# Patient Record
Sex: Male | Born: 1975 | Race: Black or African American | Hispanic: No | Marital: Married | Smoking: Former smoker
Health system: Southern US, Community
[De-identification: ages and names within clinical notes are randomized; demographics above are authoritative.]

## PROBLEM LIST (undated history)

## (undated) DIAGNOSIS — I10 Essential (primary) hypertension: Secondary | ICD-10-CM

## (undated) DIAGNOSIS — E785 Hyperlipidemia, unspecified: Secondary | ICD-10-CM

## (undated) DIAGNOSIS — G35 Multiple sclerosis: Secondary | ICD-10-CM

## (undated) DIAGNOSIS — G35D Multiple sclerosis, unspecified: Secondary | ICD-10-CM

## (undated) HISTORY — DX: Essential (primary) hypertension: I10

## (undated) HISTORY — PX: KNEE SURGERY: SHX244

## (undated) HISTORY — DX: Hyperlipidemia, unspecified: E78.5

---

## 2010-12-25 DIAGNOSIS — F909 Attention-deficit hyperactivity disorder, unspecified type: Secondary | ICD-10-CM | POA: Insufficient documentation

## 2011-02-07 DIAGNOSIS — F458 Other somatoform disorders: Secondary | ICD-10-CM | POA: Insufficient documentation

## 2011-11-30 DIAGNOSIS — M5412 Radiculopathy, cervical region: Secondary | ICD-10-CM | POA: Insufficient documentation

## 2012-01-10 DIAGNOSIS — Z9889 Other specified postprocedural states: Secondary | ICD-10-CM | POA: Insufficient documentation

## 2012-10-07 DIAGNOSIS — J45909 Unspecified asthma, uncomplicated: Secondary | ICD-10-CM | POA: Insufficient documentation

## 2012-10-07 DIAGNOSIS — E669 Obesity, unspecified: Secondary | ICD-10-CM | POA: Insufficient documentation

## 2014-10-22 DIAGNOSIS — R519 Headache, unspecified: Secondary | ICD-10-CM | POA: Insufficient documentation

## 2019-08-10 DIAGNOSIS — G35 Multiple sclerosis: Secondary | ICD-10-CM | POA: Insufficient documentation

## 2019-08-10 DIAGNOSIS — G35A Relapsing-remitting multiple sclerosis: Secondary | ICD-10-CM | POA: Insufficient documentation

## 2019-08-10 DIAGNOSIS — N400 Enlarged prostate without lower urinary tract symptoms: Secondary | ICD-10-CM | POA: Insufficient documentation

## 2019-08-10 DIAGNOSIS — G629 Polyneuropathy, unspecified: Secondary | ICD-10-CM | POA: Insufficient documentation

## 2019-11-12 DIAGNOSIS — I10 Essential (primary) hypertension: Secondary | ICD-10-CM | POA: Insufficient documentation

## 2019-12-04 ENCOUNTER — Ambulatory Visit: Payer: Self-pay | Admitting: Cardiology

## 2019-12-25 ENCOUNTER — Ambulatory Visit: Payer: Self-pay | Admitting: Cardiology

## 2020-03-26 ENCOUNTER — Other Ambulatory Visit: Payer: Self-pay

## 2020-03-26 ENCOUNTER — Encounter (HOSPITAL_COMMUNITY): Payer: Self-pay | Admitting: Emergency Medicine

## 2020-03-26 ENCOUNTER — Emergency Department (HOSPITAL_COMMUNITY)
Admission: EM | Admit: 2020-03-26 | Discharge: 2020-03-26 | Disposition: A | Discharge status: Home / Self Care | Payer: 59 | Attending: Emergency Medicine | Admitting: Emergency Medicine

## 2020-03-26 ENCOUNTER — Emergency Department (HOSPITAL_COMMUNITY): Payer: 59

## 2020-03-26 DIAGNOSIS — G35 Multiple sclerosis: Secondary | ICD-10-CM | POA: Insufficient documentation

## 2020-03-26 DIAGNOSIS — M795 Residual foreign body in soft tissue: Secondary | ICD-10-CM | POA: Insufficient documentation

## 2020-03-26 HISTORY — DX: Multiple sclerosis, unspecified: G35.D

## 2020-03-26 HISTORY — DX: Multiple sclerosis: G35

## 2020-03-26 MED ORDER — BACITRACIN ZINC 500 UNIT/GM EX OINT: 1.0000 "application " | TOPICAL_OINTMENT | Freq: Two times a day (BID) | CUTANEOUS | 0 refills | Status: DC

## 2020-03-26 MED ORDER — LIDOCAINE HCL (PF) 1 % IJ SOLN
5.0000 mL | Freq: Once | INTRAMUSCULAR | Status: DC
  Filled 2020-03-26: qty 30

## 2020-03-26 MED ORDER — CEPHALEXIN 500 MG PO CAPS: 500.0000 mg | ORAL_CAPSULE | Freq: Two times a day (BID) | ORAL | 0 refills | Status: AC

## 2020-03-26 NOTE — ED Triage Notes (Signed)
Pt states that several days ago he slid his  R foot under the bed and got a splinter. Tried to get it out but it broke off. States it is now starting to hurt more. Alert and oriented.

## 2020-03-26 NOTE — Discharge Instructions (Signed)
Let warm soapy water run over your foot Apply the bacitracin ointment Take the antibiotics as prescribed  Follow-up with PCP in 3 to 4 days for wound recheck.  Return for new or worsening symptoms such as yellow discharge, redness or swelling going up your leg.

## 2020-03-26 NOTE — ED Provider Notes (Signed)
[facility]-EMERGENCY DEPT Provider Note   CSN: [id number] Arrival date & time: 03/26/20  4098    History Chief Complaint  Patient presents with  . Foreign Body in Skin    Alex Ramirez is a 44 y.o. male with past medical history significant for MS who presents for evaluation of retained foreign object in foot. States 2 days ago slid his right foot under the bed. Hit is wooden foot board. Noticed a splinter to the dorsal aspect right foot. Was able to remove part of this however states he thought he saw a remnant of splinter in his foot. States has had increased pain and possible swelling to dorsal right foot. Last tetanus 2 days ago. No fever, chills, nausea, vomiting,  warmth, paresthesias, weakness, Ambulatory without difficulty PTA. Denies additional aggravating or alleviating factors.   History obtained from patient and past medical records. No interpretor was used.      HPI     Past Medical History:  Diagnosis Date  . MS (multiple sclerosis) (HCC)     There are no problems to display for this patient.   History reviewed     No family history on file.  Social History   Tobacco Use  . Smoking status: Never Smoker  Substance Use Topics  . Alcohol use: Not Currently  . Drug use: Never    Home Medications Prior to Admission medications   Medication Sig Start Date End Date Taking? Authorizing Provider  bacitracin ointment Apply 1 application topically 2 (two) times daily. 03/26/20   Vern Prestia A, PA-C  cephALEXin (KEFLEX) 500 MG capsule Take 1 capsule (500 mg total) by mouth 2 (two) times daily for 5 days. 03/26/20 03/31/20  Vashti Bolanos A, PA-C    Allergies    Patient has no known allergies.  Review of Systems   Review of Systems  Constitutional: Negative.   HENT: Negative.   Respiratory: Negative.   Cardiovascular: Negative.   Gastrointestinal: Negative.   Genitourinary: Negative.   Musculoskeletal: Negative.   Skin:  Positive for wound.  Neurological: Negative.   All other systems reviewed and are negative.   Physical Exam Updated Vital Signs BP 131/75 (BP Location: Left Arm)   Pulse 73   Temp 97.7 F (36.5 C) (Oral)   Resp 18   Ht 6\' 2"  (1.88 m)   Wt 117.9 kg   SpO2 100%   BMI 33.38 kg/m   Physical Exam Vitals and nursing note reviewed.  Constitutional:      General: He is not in acute distress.    Appearance: He is well-developed. He is not ill-appearing, toxic-appearing or diaphoretic.  HENT:     Head: Normocephalic and atraumatic.     Nose: Nose normal.     Mouth/Throat:     Mouth: Mucous membranes are moist.  Eyes:     Pupils: Pupils are equal, round, and reactive to light.  Cardiovascular:     Rate and Rhythm: Normal rate and regular rhythm.     Pulses: Normal pulses.     Heart sounds: Normal heart sounds.  Pulmonary:     Effort: Pulmonary effort is normal. No respiratory distress.     Breath sounds: Normal breath sounds.  Abdominal:     General: Bowel sounds are normal. There is no distension.     Palpations: Abdomen is soft. There is no mass.  Musculoskeletal:        General: Normal range of motion.     Cervical back: Normal range  of motion and neck supple.     Comments: Moves all 4 extremities without difficulty. No bony tenderness. Compartments soft.  Skin:    General: Skin is warm and dry.     Capillary Refill: Capillary refill takes less than 2 seconds.     Comments: Punctate area of scab over dorsal right foot. Mild 10mm area of erythema, mildly tender. No fluctuance or induration.  Neurological:     General: No focal deficit present.     Mental Status: He is alert and oriented to person, place, and time.       ED Results / Procedures / Treatments   Labs (all labs ordered are listed, but only abnormal results are displayed) Labs Reviewed - No data to display  EKG None  Radiology DG Foot Complete Right  Result Date: 03/26/2020 CLINICAL DATA:  Possible  retained foreign body EXAM: RIGHT FOOT COMPLETE - 3+ VIEW COMPARISON:  None. FINDINGS: No radiopaque foreign body identified.  No fractures. IMPRESSION: No radiopaque foreign body identified. Lucretia Roers is often not radiopaque however. No fractures. Electronically Signed   By: Gerome Sam III M.D   On: 03/26/2020 09:33    Procedures .Foreign Body Removal  Date/Time: 03/26/2020 10:02 AM Performed by: Ralph Leyden A, PA-C Authorized by: Ralph Leyden A, PA-C  Body area: skin General location: lower extremity Location details: right foot Anesthesia: local infiltration  Anesthesia: Local Anesthetic: lidocaine 1% without epinephrine Anesthetic total: 2 mL  Sedation: Patient sedated: no  Patient restrained: no Patient cooperative: yes Removal mechanism: forceps and scalpel Dressing: antibiotic ointment Tendon involvement: none Depth: subcutaneous Complexity: simple 1 objects recovered. Objects recovered: wood splinter Post-procedure assessment: foreign body removed Patient tolerance: patient tolerated the procedure well with no immediate complications   (including critical care time)  Medications Ordered in ED Medications  lidocaine (PF) (XYLOCAINE) 1 % injection 5 mL (5 mLs Infiltration Handoff 03/26/20 0925)   ED Course  I have reviewed the triage vital signs and the nursing notes.  Pertinent labs & imaging results that were available during my care of the patient were reviewed by me and considered in my medical decision making (see chart for details).  44 year old presents for evaluation of splinter to dorsal right foot. Afebrile, non septic, non ill appearing. Possible retained foreign body to foot. See puncture wound with mild surrounding erythema, No fluctuance or induration. NV intact. Tetanus up to date. Will plan on imaging as no visible foreign body.   X-ray does not have any foreign object.  Was able to numb the area.  Wound thoroughly cleaned.  Was able to  unroof patient scab.  Small 3 mm incision made.  Was able to remove the 4 mm wooden splinter.  Wound irrigated, bacitracin ointment placed, wound covered.  Start on antibiotics, have him follow-up outpatient for wound recheck.  The patient has been appropriately medically screened and/or stabilized in the ED. I have low suspicion for any other emergent medical condition which would require further screening, evaluation or treatment in the ED or require inpatient management.  Patient is hemodynamically stable and in no acute distress.  Patient able to ambulate in department prior to ED.  Evaluation does not show acute pathology that would require ongoing or additional emergent interventions while in the emergency department or further inpatient treatment.  I have discussed the diagnosis with the patient and answered all questions.  Pain is been managed while in the emergency department and patient has no further complaints prior to discharge.  Patient is comfortable with plan discussed in room and is stable for discharge at this time.  I have discussed strict return precautions for returning to the emergency department.  Patient was encouraged to follow-up with PCP/specialist refer to at discharge.    MDM Rules/Calculators/A&P                           Final Clinical Impression(s) / ED Diagnoses Final diagnoses:  Foreign body (FB) in soft tissue    Rx / DC Orders ED Discharge Orders         Ordered    cephALEXin (KEFLEX) 500 MG capsule  2 times daily        03/26/20 0959    bacitracin ointment  2 times daily        03/26/20 0959           Mechell Girgis A, PA-C 03/26/20 1006    Melene Plan, DO 03/26/20 1048

## 2020-04-08 ENCOUNTER — Ambulatory Visit
Admission: RE | Admit: 2020-04-08 | Discharge: 2020-04-08 | Disposition: A | Discharge status: Home / Self Care | Payer: 59 | Source: Ambulatory Visit | Attending: Internal Medicine | Admitting: Internal Medicine

## 2020-04-08 ENCOUNTER — Other Ambulatory Visit: Payer: Self-pay | Admitting: Internal Medicine

## 2020-04-08 ENCOUNTER — Other Ambulatory Visit: Payer: Self-pay

## 2020-04-08 DIAGNOSIS — R0609 Other forms of dyspnea: Secondary | ICD-10-CM

## 2020-04-16 ENCOUNTER — Emergency Department (HOSPITAL_COMMUNITY)
Admission: EM | Admit: 2020-04-16 | Discharge: 2020-04-16 | Disposition: A | Discharge status: Home / Self Care | Payer: 59 | Attending: Emergency Medicine | Admitting: Emergency Medicine

## 2020-04-16 ENCOUNTER — Emergency Department (HOSPITAL_COMMUNITY): Payer: 59

## 2020-04-16 ENCOUNTER — Other Ambulatory Visit: Payer: Self-pay

## 2020-04-16 ENCOUNTER — Encounter (HOSPITAL_COMMUNITY): Payer: Self-pay

## 2020-04-16 DIAGNOSIS — X500XXA Overexertion from strenuous movement or load, initial encounter: Secondary | ICD-10-CM | POA: Diagnosis not present

## 2020-04-16 DIAGNOSIS — M25521 Pain in right elbow: Secondary | ICD-10-CM | POA: Insufficient documentation

## 2020-04-16 NOTE — Discharge Instructions (Signed)
Suspect you likely have a strain of your elbow, potentially over the triceps tendon, please use ibuprofen, Tylenol, ice and Ace wrap if it helps provide more comfort.  If not improving please follow-up with orthopedics for further evaluation.

## 2020-04-16 NOTE — ED Provider Notes (Signed)
[facility] EMERGENCY DEPARTMENT Provider Note   CSN: [id number] Arrival date & time: 04/16/20  0730     History Chief Complaint  Patient presents with  . Arm Pain    right    Alex Ramirez is a 44 y.o. male.  Alex Ramirez is a 44 y.o. male with a history of MS, who presents to the ED for evaluation of injury to his right arm.  He reports that last night he was lifting weights and doing over the head tricep curls when his right arm and elbow seem to give out and his arm swung sideways.  He reports since then he has been having pain primarily over the elbow, but has not noted significant swelling.  He is still able to flex and extend the arm.  Reports pain radiates intermittently into the upper and lower arm, primarily with movement.  Denies any numbness tingling or weakness in the arm.  No history of injury or surgery.  He reports that he took some ibuprofen and applied ice with minimal relief.  No other aggravating or alleviating factors.        Past Medical History:  Diagnosis Date  . MS (multiple sclerosis) (HCC)     There are no problems to display for this patient.   Past Surgical History:  Procedure Laterality Date  . KNEE SURGERY Right    x 4, ACL       No family history on file.  Social History   Tobacco Use  . Smoking status: Never Smoker  Substance Use Topics  . Alcohol use: Not Currently  . Drug use: Never    Home Medications Prior to Admission medications   Medication Sig Start Date End Date Taking? Authorizing Provider  bacitracin ointment Apply 1 application topically 2 (two) times daily. 03/26/20   Henderly, Britni A, PA-C    Allergies    Patient has no known allergies.  Review of Systems   Review of Systems  Constitutional: Negative for chills and fever.  Musculoskeletal: Positive for arthralgias. Negative for joint swelling.  Skin: Negative for color change and rash.  Neurological: Negative for weakness and numbness.     Physical Exam Updated Vital Signs BP 110/69 (BP Location: Right Arm)   Pulse 82   Temp 98.1 F (36.7 C) (Oral)   Resp 18   SpO2 98%   Physical Exam Vitals and nursing note reviewed.  Constitutional:      General: He is not in acute distress.    Appearance: Normal appearance. He is well-developed, normal weight and well-nourished. He is not ill-appearing or diaphoretic.  HENT:     Head: Normocephalic and atraumatic.  Eyes:     General:        Right eye: No discharge.        Left eye: No discharge.  Pulmonary:     Effort: Pulmonary effort is normal. No respiratory distress.  Musculoskeletal:        General: Tenderness present.     Cervical back: Neck supple.     Comments: Tenderness over the right elbow around the olecranon process without any swelling or effusion, no palpable deformity, no significant swelling of the olecranon bursa, patient has full flexion and extension of the elbow with 5/5 strength against resistance, no bony or soft tissue tenderness of the forearm or upper arm.  Distal pulses 2+, normal sensation and strength.  Skin:    General: Skin is warm and dry.  Neurological:  Mental Status: He is alert and oriented to person, place, and time.     Coordination: Coordination normal.  Psychiatric:        Mood and Affect: Mood and affect and mood normal.        Behavior: Behavior normal.     ED Results / Procedures / Treatments   Labs (all labs ordered are listed, but only abnormal results are displayed) Labs Reviewed - No data to display  EKG None  Radiology DG Forearm Right  Result Date: 04/16/2020 CLINICAL DATA:  injury EXAM: RIGHT FOREARM - 2 VIEW COMPARISON:  None. FINDINGS: There is no evidence of fracture or other focal bone lesions. Soft tissues are unremarkable. IMPRESSION: No acute osseous abnormality. Electronically Signed   By: Stana Bunting M.D.   On: 04/16/2020 08:17   DG Humerus Right  Result Date: 04/16/2020 CLINICAL DATA:   Right arm pain after injury. EXAM: RIGHT HUMERUS - 2+ VIEW COMPARISON:  None. FINDINGS: There is no evidence of fracture or other focal bone lesions. Soft tissues are unremarkable. IMPRESSION: Negative. Electronically Signed   By: Lupita Raider M.D.   On: 04/16/2020 08:16    Procedures Procedures (including critical care time)  Medications Ordered in ED Medications - No data to display  ED Course  I have reviewed the triage vital signs and the nursing notes.  Pertinent labs & imaging results that were available during my care of the patient were reviewed by me and considered in my medical decision making (see chart for details).    MDM Rules/Calculators/A&P                          44 year old male presents with elbow injury while lifting weights.  He has no significant effusion on exam and the right upper extremity is neurovascularly intact.  Tenderness primarily present over the olecranon without significant swelling of the olecranon bursa.  X-rays of the forearm and humerus with no acute osseous abnormality.  Suspect he may have strained the elbow, in particular over the triceps tendon but he has intact strength of the triceps and biceps.  Encouraged RICE therapy and orthopedic follow-up if not improving.  Return precautions discussed.  Discharged home in good condition.  Final Clinical Impression(s) / ED Diagnoses Final diagnoses:  Right elbow pain    Rx / DC Orders ED Discharge Orders    None       Dartha Lodge, [place] 04/16/20 1157    LongArlyss Repress, MD 04/17/20 [phone]

## 2020-04-16 NOTE — ED Triage Notes (Signed)
Patient complains of right forearm pain after bending backwards while lifting weights last night. Took ibuprofen and iced with minimal relief

## 2020-04-22 ENCOUNTER — Other Ambulatory Visit: Payer: Self-pay | Admitting: Internal Medicine

## 2020-04-22 DIAGNOSIS — G35 Multiple sclerosis: Secondary | ICD-10-CM

## 2020-05-16 ENCOUNTER — Ambulatory Visit: Payer: Self-pay

## 2020-05-16 ENCOUNTER — Ambulatory Visit: Payer: 59 | Admitting: Physician Assistant

## 2020-05-16 ENCOUNTER — Encounter: Payer: Self-pay | Admitting: Physician Assistant

## 2020-05-16 ENCOUNTER — Ambulatory Visit (INDEPENDENT_AMBULATORY_CARE_PROVIDER_SITE_OTHER): Payer: 59 | Admitting: Physician Assistant

## 2020-05-16 DIAGNOSIS — M25572 Pain in left ankle and joints of left foot: Secondary | ICD-10-CM | POA: Diagnosis not present

## 2020-05-16 DIAGNOSIS — M25521 Pain in right elbow: Secondary | ICD-10-CM | POA: Diagnosis not present

## 2020-05-16 DIAGNOSIS — F3289 Other specified depressive episodes: Secondary | ICD-10-CM | POA: Insufficient documentation

## 2020-05-16 DIAGNOSIS — E782 Mixed hyperlipidemia: Secondary | ICD-10-CM | POA: Insufficient documentation

## 2020-05-16 DIAGNOSIS — F909 Attention-deficit hyperactivity disorder, unspecified type: Secondary | ICD-10-CM | POA: Insufficient documentation

## 2020-05-16 NOTE — Progress Notes (Signed)
Office Visit Note   Patient: Alex Ramirez           Date of Birth: 1976/04/04           MRN: [id number] Visit Date: 05/16/2020              Requested by: Andi Devon, MD [address] [place],  [place] [address] PCP: Andi Devon, MD   Assessment & Plan: Visit Diagnoses:  1. Pain in left ankle and joints of left foot   2. Pain in right elbow     Plan: Reassurance was given that the elbow appears stable and there is no evidence of fracture.  He is activities as tolerated with the right arm.  In regards to his left ankle we will place him in an ASO brace.  We will send him to formal physical therapy to work on range of motion, strengthening gait balance proprioception.  Wean out of the ASO as proprioception and strength improved.  See him back in 1 month to see what type of response he has had with physical therapy in regards to the left ankle.  Follow-Up Instructions: Return in about 4 weeks (around 06/13/2020).   Orders:  Orders Placed This Encounter  Procedures  . XR Ankle Complete Left   No orders of the defined types were placed in this encounter.     Procedures: No procedures performed   Clinical Data: No additional findings.   Subjective: Chief Complaint  Patient presents with  . Left Ankle - Pain  . Right Elbow - Pain    HPI Alex Ramirez is a 45 year old male comes in today with 2 complaints right elbow pain and left ankle pain.  He was seen in the ER on 04/16/2020 for right elbow pain.  He reports that he was lifting weights with a 100 pound dumbbell doing over tricep extensions when his right arm seem to give way on him.  Radiographs were obtained and I personally reviewed these he showed no acute fractures no bony abnormalities elbow to be well located.  Does note that the elbow pain is better but he still has some pain if he bumps the elbow. Left ankle he reports injuring 10 weeks ago on a trail he rolled the ankle and is still feels ankle is  weak and is painful lateral aspect ankle.  7 no mechanical symptoms.  States pain is worse when getting out of bed in the morning.  Had no real treatment for that the ankle pain other than ibuprofen.  Does have a medical history pertinent for MS.   Review of Systems See HPI otherwise negative  Objective: Vital Signs: There were no vitals taken for this visit.  Physical Exam Constitutional:      Appearance: He is not ill-appearing or diaphoretic.  Pulmonary:     Effort: Pulmonary effort is normal.  Neurological:     Mental Status: He is alert and oriented to person, place, and time.  Psychiatric:        Mood and Affect: Mood normal.     Ortho Exam Right elbow full range of motion.  No instability valgus varus stressing.  He is tender over the olecranon near the insertion of the triceps region.  Nontender over the medial lateral epicondyles.  Has a positive Tinel's over the right elbow ulnar nerve region.  Full supination pronation of the forearms.  Distal biceps is intact.  5 out of 5 strength with extension of both elbows against resistance and no  pain at the triceps for these.  Left ankle full dorsiflexion plantarflexion.  Inversion eversion of the foot against resistance reveals 5 5 strength.  Maximal tenderness over the distal one fourth the lateral malleolus.  No tenderness over the medial malleolus.  Achilles is nontender.  Pes planus deformity left foot.  Dorsal pedal pulses 2+. Specialty Comments:  No specialty comments available.  Imaging: XR Ankle Complete Left  Result Date: 05/16/2020 Left ankle 3 views: No acute fracture.  Talus well located within the ankle mortise.  No diastases.  No evidence of prior fracture lateral malleolus.    PMFS History: Patient Active Problem List   Diagnosis Date Noted  . 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 Adult attention deficit hyperactivity disorder 05/16/2020  . Mixed hyperlipidemia 05/16/2020  . Benign essential  hypertension 11/12/2019  . BPH (benign prostatic hyperplasia) 08/10/2019  . Neuropathy 08/10/2019  . Multiple sclerosis (HCC) 08/10/2019  . Pressure in head 10/22/2014  . Obesity 10/07/2012  . Mild asthma 10/07/2012  . S/P ACL reconstruction 01/10/2012  . Brachial neuritis or radiculitis 11/30/2011  . 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 ADHD (attention deficit hyperactivity disorder) 12/25/2010   Past Medical History:  Diagnosis Date  . MS (multiple sclerosis) (HCC)     History reviewed. No pertinent family history.  Past Surgical History:  Procedure Laterality Date  . KNEE SURGERY Right    x 4, ACL   Social History   Occupational History  . Not on file  Tobacco Use  . Smoking status: Never Smoker  . Smokeless tobacco: Not on file  Substance and Sexual Activity  . Alcohol use: Not Currently  . Drug use: Never  . Sexual activity: Not on file

## 2020-05-20 ENCOUNTER — Other Ambulatory Visit: Payer: Self-pay

## 2020-05-20 ENCOUNTER — Ambulatory Visit
Admission: RE | Admit: 2020-05-20 | Discharge: 2020-05-20 | Disposition: A | Discharge status: Home / Self Care | Payer: 59 | Source: Ambulatory Visit | Attending: Internal Medicine | Admitting: Internal Medicine

## 2020-05-20 DIAGNOSIS — G35 Multiple sclerosis: Secondary | ICD-10-CM

## 2020-05-20 MED ORDER — GADOBENATE DIMEGLUMINE 529 MG/ML IV SOLN
20.0000 mL | Freq: Once | INTRAVENOUS | Status: AC | PRN
  Administered 2020-05-20: 20 mL via INTRAVENOUS

## 2020-05-24 ENCOUNTER — Other Ambulatory Visit: Payer: 59

## 2020-05-29 ENCOUNTER — Ambulatory Visit
Admission: RE | Admit: 2020-05-29 | Discharge: 2020-05-29 | Disposition: A | Discharge status: Home / Self Care | Payer: 59 | Source: Ambulatory Visit | Attending: Internal Medicine | Admitting: Internal Medicine

## 2020-05-29 ENCOUNTER — Other Ambulatory Visit: Payer: Self-pay

## 2020-05-29 DIAGNOSIS — G35 Multiple sclerosis: Secondary | ICD-10-CM

## 2020-05-29 MED ORDER — GADOBENATE DIMEGLUMINE 529 MG/ML IV SOLN
20.0000 mL | Freq: Once | INTRAVENOUS | Status: AC | PRN
  Administered 2020-05-29: 20 mL via INTRAVENOUS

## 2020-06-13 ENCOUNTER — Ambulatory Visit: Payer: 59 | Admitting: Physician Assistant

## 2020-11-29 ENCOUNTER — Telehealth: Payer: Self-pay

## 2020-11-29 NOTE — Telephone Encounter (Signed)
Referral notes sent from [facility], Phone #: [phone], Fax #: [phone]   Notes sent to scheduling

## 2020-12-26 ENCOUNTER — Other Ambulatory Visit (HOSPITAL_COMMUNITY): Payer: Self-pay | Admitting: Internal Medicine

## 2020-12-27 ENCOUNTER — Ambulatory Visit (HOSPITAL_COMMUNITY)
Admission: RE | Admit: 2020-12-27 | Discharge: 2020-12-27 | Disposition: A | Discharge status: Home / Self Care | Payer: Self-pay | Source: Ambulatory Visit | Attending: Internal Medicine | Admitting: Internal Medicine

## 2020-12-27 ENCOUNTER — Other Ambulatory Visit: Payer: Self-pay

## 2020-12-27 DIAGNOSIS — E785 Hyperlipidemia, unspecified: Secondary | ICD-10-CM | POA: Insufficient documentation

## 2020-12-27 DIAGNOSIS — Z136 Encounter for screening for cardiovascular disorders: Secondary | ICD-10-CM | POA: Insufficient documentation

## 2021-01-10 ENCOUNTER — Other Ambulatory Visit: Payer: Self-pay

## 2021-01-10 ENCOUNTER — Ambulatory Visit (INDEPENDENT_AMBULATORY_CARE_PROVIDER_SITE_OTHER): Payer: 59

## 2021-01-10 ENCOUNTER — Encounter: Payer: Self-pay | Admitting: Cardiology

## 2021-01-10 ENCOUNTER — Ambulatory Visit (INDEPENDENT_AMBULATORY_CARE_PROVIDER_SITE_OTHER): Payer: 59 | Admitting: Cardiology

## 2021-01-10 VITALS — BP 122/76 | HR 83 | Ht 74.0 in | Wt 287.0 lb

## 2021-01-10 DIAGNOSIS — R0602 Shortness of breath: Secondary | ICD-10-CM | POA: Insufficient documentation

## 2021-01-10 DIAGNOSIS — E782 Mixed hyperlipidemia: Secondary | ICD-10-CM

## 2021-01-10 DIAGNOSIS — G35 Multiple sclerosis: Secondary | ICD-10-CM | POA: Diagnosis not present

## 2021-01-10 DIAGNOSIS — E669 Obesity, unspecified: Secondary | ICD-10-CM | POA: Diagnosis not present

## 2021-01-10 DIAGNOSIS — I10 Essential (primary) hypertension: Secondary | ICD-10-CM

## 2021-01-10 DIAGNOSIS — R002 Palpitations: Secondary | ICD-10-CM

## 2021-01-10 NOTE — Patient Instructions (Signed)
Medication Instructions:  Your physician recommends that you continue on your current medications as directed. Please refer to the Current Medication list given to you today.  *If you need a refill on your cardiac medications before your next appointment, please call your pharmacy*   Lab Work: None If you have labs (blood work) drawn today and your tests are completely normal, you will receive your results only by: MyChart Message (if you have MyChart) OR A paper copy in the mail If you have any lab test that is abnormal or we need to change your treatment, we will call you to review the results.   Testing/Procedures: Your physician has requested that you have an echocardiogram. Echocardiography is a painless test that uses sound waves to create images of your heart. It provides your doctor with information about the size and shape of your heart and how well your heart's chambers and valves are working. This procedure takes approximately one hour. There are no restrictions for this procedure.  A zio monitor was ordered today. It will remain on for 14 days. You will then return monitor and event diary in provided box. It takes 1-2 weeks for report to be downloaded and returned to [place]. We will call you with the results. If monitor falls off or has orange flashing light, please call Zio for further instructions.   ZIO  WHY IS MY DOCTOR PRESCRIBING ZIO? The Zio system is proven and trusted by physicians to detect and diagnose irregular heart rhythms -- and has been prescribed to hundreds of thousands of patients.  The FDA has cleared the Zio system to monitor for many different kinds of irregular heart rhythms. In a study, physicians were able to reach a diagnosis 90% of the time with the Zio system1.  You can wear the Zio monitor -- a small, discreet, comfortable patch -- during your normal day-to-day activity, including while you sleep, shower, and exercise, while it records every single  heartbeat for analysis.  1Barrett, P., et al. Comparison of 24 Hour Holter Monitoring Versus 14 Day Novel Adhesive Patch Electrocardiographic Monitoring. American Journal of Medicine, 2014.  ZIO VS. HOLTER MONITORING The Zio monitor can be comfortably worn for up to 14 days. Holter monitors can be worn for 24 to 48 hours, limiting the time to record any irregular heart rhythms you may have. Zio is able to capture data for the 51% of patients who have their first symptom-triggered arrhythmia after 48 hours.1  LIVE WITHOUT RESTRICTIONS The Zio ambulatory cardiac monitor is a small, unobtrusive, and water-resistant patch--you might even forget you're wearing it. The Zio monitor records and stores every beat of your heart, whether you're sleeping, working out, or showering. Remove 14 days after application.    Follow-Up: At [facility], you and your health needs are our priority.  As part of our continuing mission to provide you with exceptional heart care, we have created designated Provider Care Teams.  These Care Teams include your primary Cardiologist (physician) and Advanced Practice Providers (APPs -  Physician Assistants and Nurse Practitioners) who all work together to provide you with the care you need, when you need it.  We recommend signing up for the patient portal called "MyChart".  Sign up information is provided on this After Visit Summary.  MyChart is used to connect with patients for Virtual Visits (Telemedicine).  Patients are able to view lab/test results, encounter notes, upcoming appointments, etc.  Non-urgent messages can be sent to your provider as well.  To learn more about what you can do with MyChart, go to [email or web address].    Your next appointment:   6 month(s)  The format for your next appointment:   In Person  Provider:   Thomasene Ripple, DO [address], [place], [place] [address]    Other Instructions Echocardiogram An echocardiogram is a test  that uses sound waves (ultrasound) to produce images of the heart. Images from an echocardiogram can provide important information about: Heart size and shape. The size and thickness and movement of your heart's walls. Heart muscle function and strength. Heart valve function or if you have stenosis. Stenosis is when the heart valves are too narrow. If blood is flowing backward through the heart valves (regurgitation). A tumor or infectious growth around the heart valves. Areas of heart muscle that are not working well because of poor blood flow or injury from a heart attack. Aneurysm detection. An aneurysm is a weak or damaged part of an artery wall. The wall bulges out from the normal force of blood pumping through the body. Tell a health care provider about: Any allergies you have. All medicines you are taking, including vitamins, herbs, eye drops, creams, and over-the-counter medicines. Any blood disorders you have. Any surgeries you have had. Any medical conditions you have. Whether you are pregnant or may be pregnant. What are the risks? Generally, this is a safe test. However, problems may occur, including an allergic reaction to dye (contrast) that may be used during the test. What happens before the test? No specific preparation is needed. You may eat and drink normally. What happens during the test?  You will take off your clothes from the waist up and put on a hospital gown. Electrodes or electrocardiogram (ECG)patches may be placed on your chest. The electrodes or patches are then connected to a device that monitors your heart rate and rhythm. You will lie down on a table for an ultrasound exam. A gel will be applied to your chest to help sound waves pass through your skin. A handheld device, called a transducer, will be pressed against your chest and moved over your heart. The transducer produces sound waves that travel to your heart and bounce back (or "echo" back) to the  transducer. These sound waves will be captured in real-time and changed into images of your heart that can be viewed on a video monitor. The images will be recorded on a computer and reviewed by your health care provider. You may be asked to change positions or hold your breath for a short time. This makes it easier to get different views or better views of your heart. In some cases, you may receive contrast through an IV in one of your veins. This can improve the quality of the pictures from your heart. The procedure may vary among health care providers and hospitals. What can I expect after the test? You may return to your normal, everyday life, including diet, activities, and medicines, unless your health care provider tells you not to do that. Follow these instructions at home: It is up to you to get the results of your test. Ask your health care provider, or the department that is doing the test, when your results will be ready. Keep all follow-up visits. This is important. Summary An echocardiogram is a test that uses sound waves (ultrasound) to produce images of the heart. Images from an echocardiogram can provide important information about the size and shape of your heart, heart muscle function,  heart valve function, and other possible heart problems. You do not need to do anything to prepare before this test. You may eat and drink normally. After the echocardiogram is completed, you may return to your normal, everyday life, unless your health care provider tells you not to do that. This information is not intended to replace advice given to you by your health care provider. Make sure you discuss any questions you have with your health care provider. Document Revised: 12/15/2019 Document Reviewed: 12/15/2019 Elsevier Patient Education  2022 [employer].

## 2021-01-10 NOTE — Progress Notes (Signed)
Cardiology Office Note:    Date:  01/10/2021   ID:  Alex Ramirez, DOB 1975-05-30, MRN [id number]  PCP:  Andi Devon, MD  Cardiologist:  Thomasene Ripple, DO  Electrophysiologist:  None   Referring MD: Andi Devon, MD   " I am having palpitations"  History of Present Illness:    Alex Ramirez is a 45 y.o. male with a hx of hypertension, hyperlipidemia, obesity, multiple sclerosis follows with neurology is here today to be evaluated for palpitations as well as establish cardiac care.  The patient tells me that recently he has been experiencing intermittent palpitations.  He described it as mostly an abrupt onset of fast heartbeat which last for 30 to 40 minutes most times and then resolves.  Sometimes they resolve abruptly and sometimes is a slow offset.  Admits to associated shortness of breath. He also has had some chest pain and underwent a coronary CT calcium scoring which did show 0 calcium.  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  At that time he told me that the previous cardiologist had recommended possible pacemaker giving the medications that he was going to be on.  But that was not pursued.  He has not had any syncope episode, no lightheadedness or dizziness.  He is tolerating his current medical therapy for his multiple sclerosis.  Past Medical History:  Diagnosis Date   MS (multiple sclerosis) (HCC)     Past Surgical History:  Procedure Laterality Date   KNEE SURGERY Right    x 4, ACL    Current Medications: Current Meds  Medication Sig   amphetamine-dextroamphetamine (ADDERALL) 10 MG tablet Take 10 mg by mouth daily.   bacitracin ointment Apply 1 application topically 2 (two) times daily.   baclofen (LIORESAL) 10 MG tablet Take 10 mg by mouth 3 (three) times daily.   buPROPion (WELLBUTRIN SR) 150 MG 12 hr tablet Take 150 mg by mouth 2  (two) times daily.   busPIRone (BUSPAR) 5 MG tablet Take 5 mg by mouth 2 (two) times daily.   Dimethyl Fumarate 120 MG CPDR 1 capsule in the morning and at bedtime.   ezetimibe (ZETIA) 10 MG tablet Take 10 mg by mouth daily.   gabapentin (NEURONTIN) 600 MG tablet Take 600 mg by mouth 3 (three) times daily.   hydrochlorothiazide (HYDRODIURIL) 12.5 MG tablet Take 12.5 mg by mouth daily.   lamoTRIgine (LAMICTAL) 100 MG tablet Take 100 mg by mouth 2 (two) times daily.   terazosin (HYTRIN) 5 MG capsule 5 mg 2 (two) times daily.   [DISCONTINUED] methylphenidate (RITALIN) 10 MG tablet Take 10 mg by mouth daily.     Allergies:   Other and Oxycodone   Social History   Socioeconomic History   Marital status: Married    Spouse name: Not on file   Number of children: Not on file   Years of education: Not on file   Highest education level: Not on file  Occupational History   Not on file  Tobacco Use   Smoking status: Former    Types: Cigarettes    Passive exposure: Past   Smokeless tobacco: Never  Vaping Use   Vaping Use: Never used  Substance and Sexual Activity   Alcohol use: Yes    Comment: couple times weekly   Drug use: Never   Sexual activity: Not on file  Other Topics  Concern   Not on file  Social History Narrative   Not on file   Social Determinants of Health   Financial Resource Strain: Not on file  Food Insecurity: Not on file  Transportation Needs: Not on file  Physical Activity: Not on file  Stress: Not on file  Social Connections: Not on file     Family History: The patient's family history includes Breast cancer in his maternal grandmother; Congestive Heart Failure in his father and mother; Diabetes in his brother, father, mother, paternal grandfather, paternal grandmother, sister, and sister; Healthy in his brother, brother, brother, brother, sister, sister, sister, sister, sister, and sister; Heart attack (age of onset: 24) in his father; High Cholesterol in his  brother and father; Hypertension in his mother and paternal grandfather.  ROS:   Review of Systems  Constitution: Negative for decreased appetite, fever and weight gain.  HENT: Negative for congestion, ear discharge, hoarse voice and sore throat.   Eyes: Negative for discharge, redness, vision loss in right eye and visual halos.  Cardiovascular: Reports palpitations and shortness of breath.  Gated for chest pain, leg swelling, orthopnea and palpitations.  Respiratory: Negative for cough, hemoptysis, shortness of breath and snoring.   Endocrine: Negative for heat intolerance and polyphagia.  Hematologic/Lymphatic: Negative for bleeding problem. Does not bruise/bleed easily.  Skin: Negative for flushing, nail changes, rash and suspicious lesions.  Musculoskeletal: Negative for arthritis, joint pain, muscle cramps, myalgias, neck pain and stiffness.  Gastrointestinal: Negative for abdominal pain, bowel incontinence, diarrhea and excessive appetite.  Genitourinary: Negative for decreased libido, genital sores and incomplete emptying.  Neurological: Negative for brief paralysis, focal weakness, headaches and loss of balance.  Psychiatric/Behavioral: Negative for altered mental status, depression and suicidal ideas.  Allergic/Immunologic: Negative for HIV exposure and persistent infections.    EKGs/Labs/Other Studies Reviewed:    The following studies were reviewed today:   EKG:  The ekg ordered today demonstrates sinus rhythm 83 bpm  CT cardiac scoring 12/27/2020 CLINICAL DATA:  Cardiovascular Disease Risk stratification  EXAM: Coronary Calcium Score  TECHNIQUE: A gated, non-contrast computed tomography scan of the heart was performed using 47mm slice thickness. Axial images were analyzed on a dedicated workstation. Calcium scoring of the coronary arteries was performed using the Agatston method.   FINDINGS: Coronary Calcium Score:   Left main: 0   Left anterior descending artery:  0   Left circumflex artery: 0   Right coronary artery: 0   Total: 0   Percentile: 0   Pericardium: Normal.   Ascending Aorta: Normal caliber.   Non-cardiac: See separate report from [facility].   IMPRESSION: Coronary calcium score of 0. This was 0 percentile for age-, race-, and sex-matched controls.   RECOMMENDATIONS: Coronary artery calcium (CAC) score is a strong predictor of incident coronary heart disease (CHD) and provides predictive information beyond traditional risk factors. CAC scoring is reasonable to use in the decision to withhold, postpone, or initiate statin therapy in intermediate-risk or selected borderline-risk asymptomatic adults (age 35-75 years and LDL-C >=70 to <190 mg/dL) who do not have diabetes or established atherosclerotic cardiovascular disease (ASCVD).* In intermediate-risk (10-year ASCVD risk >=7.5% to <20%) adults or selected borderline-risk (10-year ASCVD risk >=5% to <7.5%) adults in whom a CAC score is measured for the purpose of making a treatment decision the following recommendations have been made:   If CAC=0, it is reasonable to withhold statin therapy and reassess in 5 to 10 years, as long as higher risk conditions are absent (  diabetes mellitus, family history of premature CHD in first degree relatives (males <55 years; females <65 years), cigarette smoking, or LDL >=190 mg/dL).   If CAC is 1 to 99, it is reasonable to initiate statin therapy for patients >=30 years of age.   If CAC is >=100 or >=75th percentile, it is reasonable to initiate statin therapy at any age.   Cardiology referral should be considered for patients with CAC scores >=400 or >=75th percentile.   *2018 AHA/ACC/AACVPR/AAPA/ABC/ACPM/ADA/AGS/APhA/ASPC/NLA/PCNA Guideline on the Management of Blood Cholesterol: A Report of the American College of Cardiology/American Heart Association Task Force on Clinical Practice Guidelines. J Am Coll  Cardiol. 2019;73(24):3168-3209.   Olga Millers, MD Electronically Signed  Zio monitor 01/2020 Cardiology Laboratory  [facility] Name: Alex Ramirez    Age: 74 Y  MRN: [id number]   DOB: [birth year]  Beginning Study Date: 01/02/20-01/13/20                              Cardiac Monitor Report    Reason for Study:  Second degree AVB  Type of cardiac monitor:  Zio Patch  Cardiac monitor was worn for 10 days and 18 hours.     Interpretation Summary   1. Recording quality: Fair  2. Sinus Rhythm was observed.  Average HR 88, Minimum 57, Maximum  171.  3. There were no pauses.         Second degree AVB mobitz type I was present.  Wenkebach was  detected within +/- 45 seconds of symptomatic patient event.       AV Block (2nd Mobitz II, 3rd) was not present.  4. Supraventricular Ectopic (SVE) beat burden was: <1.0%      There were no episodes of SVT.  5. Atrial fibrillation and/or flutter was not present.  6. PVC burden was: <1.0%       There were no episodes of wide complex tachycardia.  7. There were 5 patient triggered events which show Sinus and  SVEs.      There were 7 patient symptom episodes which correlate with  sinus, wenckebach, SVEs and VEs.   IMPRESSION:  Results demonstrate predominantly sinus rhythm with episodes of  symptomatic second degree AVB mobitz type I (wenkebach).      I have personally reviewed the results of this test and agree  with the report above.   Jana Half, MD  Assistant Professor (Clinical), Cardiac Electrophysiology  01/26/2020 Specimen Collected: 01/26/20 14:34    Recent Labs: No results found for requested labs within last 8760 hours.  Recent Lipid Panel No results found for: CHOL, TRIG, HDL, CHOLHDL, VLDL, LDLCALC, LDLDIRECT  Physical Exam:    VS:  BP 122/76 (BP Location: Left Arm, Patient Position: Sitting, Cuff Size: Large)   Pulse 83   Ht 6\' 2"  (1.88 m)   Wt 287 lb (130.2 kg)   SpO2 97%   BMI 36.85 kg/m      Wt Readings from Last 3 Encounters:  01/10/21 287 lb (130.2 kg)  03/26/20 260 lb (117.9 kg)     GEN: Well nourished, well developed in no acute distress HEENT: Normal NECK: No JVD; No carotid bruits LYMPHATICS: No lymphadenopathy CARDIAC: S1S2 noted,RRR, no murmurs, rubs, gallops RESPIRATORY:  Clear to auscultation without rales, wheezing or rhonchi  ABDOMEN: Soft, non-tender, non-distended, +bowel sounds, no guarding. EXTREMITIES: No edema, No cyanosis, no clubbing MUSCULOSKELETAL:  No deformity  SKIN: Warm and dry NEUROLOGIC:  Alert  and oriented x 3, non-focal PSYCHIATRIC:  Normal affect, good insight  ASSESSMENT:    1. SOB (shortness of breath)   2. Palpitations   3. Obesity (BMI 30-39.9)   4. Multiple sclerosis (HCC)   5. Benign essential hypertension   6. Mixed hyperlipidemia    PLAN:     1.  I would like to rule out a cardiovascular etiology of this palpitation, therefore at this time I would like to placed a zio patch for  14  days. In additon for his shortness of breath a transthoracic echocardiogram will be ordered to assess LV/RV function and any structural abnormalities. Once these testing have been performed amd reviewed further reccomendations will be made. For now, I do reccomend that the patient goes to the nearest ED if  symptoms recur.  Blood pressure is acceptable, continue with current antihypertensive regimen..  Hyperlipidemia - continue with current statin medication.  The patient understands the need to lose weight with diet and exercise. We have discussed specific strategies for this.  The patient is in agreement with the above plan. The patient left the office in stable condition.  The patient will follow up in 6 months or sooner if needed.   Medication Adjustments/Labs and Tests Ordered: Current medicines are reviewed at length with the patient today.  Concerns regarding medicines are outlined above.  Orders Placed This Encounter  Procedures    LONG TERM MONITOR (3-14 DAYS)   EKG 12-Lead   ECHOCARDIOGRAM COMPLETE    No orders of the defined types were placed in this encounter.   Patient Instructions  Medication Instructions:  Your physician recommends that you continue on your current medications as directed. Please refer to the Current Medication list given to you today.  *If you need a refill on your cardiac medications before your next appointment, please call your pharmacy*   Lab Work: None If you have labs (blood work) drawn today and your tests are completely normal, you will receive your results only by: MyChart Message (if you have MyChart) OR A paper copy in the mail If you have any lab test that is abnormal or we need to change your treatment, we will call you to review the results.   Testing/Procedures: Your physician has requested that you have an echocardiogram. Echocardiography is a painless test that uses sound waves to create images of your heart. It provides your doctor with information about the size and shape of your heart and how well your heart's chambers and valves are working. This procedure takes approximately one hour. There are no restrictions for this procedure.  A zio monitor was ordered today. It will remain on for 14 days. You will then return monitor and event diary in provided box. It takes 1-2 weeks for report to be downloaded and returned to [place]. We will call you with the results. If monitor falls off or has orange flashing light, please call Zio for further instructions.   ZIO  WHY IS MY DOCTOR PRESCRIBING ZIO? The Zio system is proven and trusted by physicians to detect and diagnose irregular heart rhythms -- and has been prescribed to hundreds of thousands of patients.  The FDA has cleared the Zio system to monitor for many different kinds of irregular heart rhythms. In a study, physicians were able to reach a diagnosis 90% of the time with the Zio system1.  You can wear the Zio monitor  -- a small, discreet, comfortable patch -- during your normal day-to-day activity, including while you  sleep, shower, and exercise, while it records every single heartbeat for analysis.  1Barrett, P., et al. Comparison of 24 Hour Holter Monitoring Versus 14 Day Novel Adhesive Patch Electrocardiographic Monitoring. American Journal of Medicine, 2014.  ZIO VS. HOLTER MONITORING The Zio monitor can be comfortably worn for up to 14 days. Holter monitors can be worn for 24 to 48 hours, limiting the time to record any irregular heart rhythms you may have. Zio is able to capture data for the 51% of patients who have their first symptom-triggered arrhythmia after 48 hours.1  LIVE WITHOUT RESTRICTIONS The Zio ambulatory cardiac monitor is a small, unobtrusive, and water-resistant patch--you might even forget you're wearing it. The Zio monitor records and stores every beat of your heart, whether you're sleeping, working out, or showering. Remove 14 days after application.    Follow-Up: At [facility], you and your health needs are our priority.  As part of our continuing mission to provide you with exceptional heart care, we have created designated Provider Care Teams.  These Care Teams include your primary Cardiologist (physician) and Advanced Practice Providers (APPs -  Physician Assistants and Nurse Practitioners) who all work together to provide you with the care you need, when you need it.  We recommend signing up for the patient portal called "MyChart".  Sign up information is provided on this After Visit Summary.  MyChart is used to connect with patients for Virtual Visits (Telemedicine).  Patients are able to view lab/test results, encounter notes, upcoming appointments, etc.  Non-urgent messages can be sent to your provider as well.   To learn more about what you can do with MyChart, go to [email or web address].    Your next appointment:   6 month(s)  The format for your next appointment:    In Person  Provider:   Thomasene Ripple, DO [address], [place], [place] [address]    Other Instructions Echocardiogram An echocardiogram is a test that uses sound waves (ultrasound) to produce images of the heart. Images from an echocardiogram can provide important information about: Heart size and shape. The size and thickness and movement of your heart's walls. Heart muscle function and strength. Heart valve function or if you have stenosis. Stenosis is when the heart valves are too narrow. If blood is flowing backward through the heart valves (regurgitation). A tumor or infectious growth around the heart valves. Areas of heart muscle that are not working well because of poor blood flow or injury from a heart attack. Aneurysm detection. An aneurysm is a weak or damaged part of an artery wall. The wall bulges out from the normal force of blood pumping through the body. Tell a health care provider about: Any allergies you have. All medicines you are taking, including vitamins, herbs, eye drops, creams, and over-the-counter medicines. Any blood disorders you have. Any surgeries you have had. Any medical conditions you have. Whether you are pregnant or may be pregnant. What are the risks? Generally, this is a safe test. However, problems may occur, including an allergic reaction to dye (contrast) that may be used during the test. What happens before the test? No specific preparation is needed. You may eat and drink normally. What happens during the test?  You will take off your clothes from the waist up and put on a hospital gown. Electrodes or electrocardiogram (ECG)patches may be placed on your chest. The electrodes or patches are then connected to a device that monitors your heart rate and rhythm. You will lie down  on a table for an ultrasound exam. A gel will be applied to your chest to help sound waves pass through your skin. A handheld device, called a transducer, will be  pressed against your chest and moved over your heart. The transducer produces sound waves that travel to your heart and bounce back (or "echo" back) to the transducer. These sound waves will be captured in real-time and changed into images of your heart that can be viewed on a video monitor. The images will be recorded on a computer and reviewed by your health care provider. You may be asked to change positions or hold your breath for a short time. This makes it easier to get different views or better views of your heart. In some cases, you may receive contrast through an IV in one of your veins. This can improve the quality of the pictures from your heart. The procedure may vary among health care providers and hospitals. What can I expect after the test? You may return to your normal, everyday life, including diet, activities, and medicines, unless your health care provider tells you not to do that. Follow these instructions at home: It is up to you to get the results of your test. Ask your health care provider, or the department that is doing the test, when your results will be ready. Keep all follow-up visits. This is important. Summary An echocardiogram is a test that uses sound waves (ultrasound) to produce images of the heart. Images from an echocardiogram can provide important information about the size and shape of your heart, heart muscle function, heart valve function, and other possible heart problems. You do not need to do anything to prepare before this test. You may eat and drink normally. After the echocardiogram is completed, you may return to your normal, everyday life, unless your health care provider tells you not to do that. This information is not intended to replace advice given to you by your health care provider. Make sure you discuss any questions you have with your health care provider. Document Revised: 12/15/2019 Document Reviewed: 12/15/2019 Elsevier Patient Education   2022 Elsevier Inc.     Adopting a Healthy Lifestyle.  Know what a healthy weight is for you (roughly BMI <25) and aim to maintain this   Aim for 7+ servings of fruits and vegetables daily   65-80+ fluid ounces of water or unsweet tea for healthy kidneys   Limit to max 1 drink of alcohol per day; avoid smoking/tobacco   Limit animal fats in diet for cholesterol and heart health - choose grass fed whenever available   Avoid highly processed foods, and foods high in saturated/trans fats   Aim for low stress - take time to unwind and care for your mental health   Aim for 150 min of moderate intensity exercise weekly for heart health, and weights twice weekly for bone health   Aim for 7-9 hours of sleep daily   When it comes to diets, agreement about the perfect plan isnt easy to find, even among the experts. Experts at the [facility] of [employer] developed an idea known as the Healthy Eating Plate. Just imagine a plate divided into logical, healthy portions.   The emphasis is on diet quality:   Load up on vegetables and fruits - one-half of your plate: Aim for color and variety, and remember that potatoes dont count.   Go for whole grains - one-quarter of your plate: Whole wheat, barley, wheat berries, quinoa,  oats, brown rice, and foods made with them. If you want pasta, go with whole wheat pasta.   Protein power - one-quarter of your plate: Fish, chicken, beans, and nuts are all healthy, versatile protein sources. Limit red meat.   The diet, however, does go beyond the plate, offering a few other suggestions.   Use healthy plant oils, such as olive, canola, soy, corn, sunflower and peanut. Check the labels, and avoid partially hydrogenated oil, which have unhealthy trans fats.   If youre thirsty, drink water. Coffee and tea are good in moderation, but skip sugary drinks and limit milk and dairy products to one or two daily servings.   The type of carbohydrate in the  diet is more important than the amount. Some sources of carbohydrates, such as vegetables, fruits, whole grains, and beans-are healthier than others.   Finally, stay active  Signed, Thomasene RippleKardie Yarethzi Branan, DO  01/10/2021 12:07 PM    [facility]

## 2021-01-11 ENCOUNTER — Other Ambulatory Visit: Payer: Self-pay

## 2021-01-11 DIAGNOSIS — R002 Palpitations: Secondary | ICD-10-CM

## 2021-01-23 ENCOUNTER — Ambulatory Visit (HOSPITAL_BASED_OUTPATIENT_CLINIC_OR_DEPARTMENT_OTHER): Admission: RE | Admit: 2021-01-23 | Payer: 59 | Source: Ambulatory Visit

## 2021-01-23 DIAGNOSIS — R002 Palpitations: Secondary | ICD-10-CM | POA: Diagnosis not present

## 2021-02-06 ENCOUNTER — Ambulatory Visit (HOSPITAL_BASED_OUTPATIENT_CLINIC_OR_DEPARTMENT_OTHER): Admission: RE | Admit: 2021-02-06 | Payer: 59 | Source: Ambulatory Visit

## 2021-02-17 ENCOUNTER — Other Ambulatory Visit: Payer: Self-pay

## 2021-02-17 ENCOUNTER — Ambulatory Visit (HOSPITAL_BASED_OUTPATIENT_CLINIC_OR_DEPARTMENT_OTHER)
Admission: RE | Admit: 2021-02-17 | Discharge: 2021-02-17 | Disposition: A | Discharge status: Home / Self Care | Payer: 59 | Source: Ambulatory Visit | Attending: Cardiology | Admitting: Cardiology

## 2021-02-17 DIAGNOSIS — R0602 Shortness of breath: Secondary | ICD-10-CM

## 2021-02-17 LAB — ECHOCARDIOGRAM COMPLETE
AR max vel: 3.18 cm2
AV Area VTI: 2.95 cm2
AV Area mean vel: 2.98 cm2
AV Mean grad: 4 mmHg
AV Peak grad: 6.6 mmHg
Ao pk vel: 1.28 m/s
Area-P 1/2: 3.43 cm2
Calc EF: 56.8 %
S' Lateral: 4.4 cm
Single Plane A2C EF: 59.3 %
Single Plane A4C EF: 59.8 %

## 2021-02-24 ENCOUNTER — Other Ambulatory Visit: Payer: Self-pay

## 2021-02-24 ENCOUNTER — Ambulatory Visit (INDEPENDENT_AMBULATORY_CARE_PROVIDER_SITE_OTHER): Payer: 59 | Admitting: Cardiology

## 2021-02-24 ENCOUNTER — Encounter: Payer: Self-pay | Admitting: Cardiology

## 2021-02-24 VITALS — BP 120/78 | HR 71 | Ht 74.0 in | Wt 278.4 lb

## 2021-02-24 DIAGNOSIS — I10 Essential (primary) hypertension: Secondary | ICD-10-CM | POA: Diagnosis not present

## 2021-02-24 DIAGNOSIS — E669 Obesity, unspecified: Secondary | ICD-10-CM

## 2021-02-24 DIAGNOSIS — E782 Mixed hyperlipidemia: Secondary | ICD-10-CM

## 2021-02-24 NOTE — Progress Notes (Signed)
Cardiology Office Note:    Date:  02/24/2021   ID:  Alex Ramirez, DOB 1975/11/26, MRN [id number]  PCP:  Andi Devon, MD  Cardiologist:  Thomasene Ripple, DO  Electrophysiologist:  None   Referring MD: Andi Devon, MD   " I am dong well"  History of Present Illness:   . Alex Ramirez is a 45 y.o. male with a hx of  hypertension, hyperlipidemia, obesity, multiple sclerosis follows with neurology is here today for a follow up visit.  Last saw the patient on January 10, 2021 at that time he presented to establish cardiac care.  During that visit he did tell me that he had been experiencing some palpitations.  He also had been admitted to the hospital for shortness of breath.  He underwent a coronary CT scan which showed 0 calcium.  Clinic visit I placed a monitor on the patient as well as got an echocardiogram to assess his LV function.  He is here today for follow-up visit.  Thankfully he has not had anySymptoms since I saw him.  He is doing well from a cardiovascular standpoint.  Past Medical History:  Diagnosis Date   MS (multiple sclerosis) (HCC)     Past Surgical History:  Procedure Laterality Date   KNEE SURGERY Right    x 4, ACL    Current Medications: Current Meds  Medication Sig   amphetamine-dextroamphetamine (ADDERALL) 10 MG tablet Take 10 mg by mouth daily.   bacitracin ointment Apply 1 application topically 2 (two) times daily.   baclofen (LIORESAL) 10 MG tablet Take 10 mg by mouth 3 (three) times daily.   buPROPion (WELLBUTRIN SR) 150 MG 12 hr tablet Take 150 mg by mouth 2 (two) times daily.   busPIRone (BUSPAR) 5 MG tablet Take 5 mg by mouth 2 (two) times daily.   Dimethyl Fumarate 120 MG CPDR 1 capsule in the morning and at bedtime.   ezetimibe (ZETIA) 10 MG tablet Take 10 mg by mouth daily.   gabapentin (NEURONTIN) 600 MG tablet Take 600 mg by mouth 3 (three) times daily.   hydrochlorothiazide (HYDRODIURIL) 12.5 MG tablet Take 12.5 mg by mouth daily.    lamoTRIgine (LAMICTAL) 100 MG tablet Take 100 mg by mouth 2 (two) times daily.   terazosin (HYTRIN) 5 MG capsule 5 mg 2 (two) times daily.     Allergies:   Other and Oxycodone   Social History   Socioeconomic History   Marital status: Married    Spouse name: Not on file   Number of children: Not on file   Years of education: Not on file   Highest education level: Not on file  Occupational History   Not on file  Tobacco Use   Smoking status: Former    Types: Cigarettes    Passive exposure: Past   Smokeless tobacco: Never  Vaping Use   Vaping Use: Never used  Substance and Sexual Activity   Alcohol use: Yes    Comment: couple times weekly   Drug use: Never   Sexual activity: Not on file  Other Topics Concern   Not on file  Social History Narrative   Not on file   Social Determinants of Health   Financial Resource Strain: Not on file  Food Insecurity: Not on file  Transportation Needs: Not on file  Physical Activity: Not on file  Stress: Not on file  Social Connections: Not on file     Family History: The patient's family history includes Breast cancer in his  maternal grandmother; Congestive Heart Failure in his father and mother; Diabetes in his brother, father, mother, paternal grandfather, paternal grandmother, sister, and sister; Healthy in his brother, brother, brother, brother, sister, sister, sister, sister, sister, and sister; Heart attack (age of onset: 63) in his father; High Cholesterol in his brother and father; Hypertension in his mother and paternal grandfather.  ROS:   Review of Systems  Constitution: Negative for decreased appetite, fever and weight gain.  HENT: Negative for congestion, ear discharge, hoarse voice and sore throat.   Eyes: Negative for discharge, redness, vision loss in right eye and visual halos.  Cardiovascular: Negative for chest pain, dyspnea on exertion, leg swelling, orthopnea and palpitations.  Respiratory: Negative for cough,  hemoptysis, shortness of breath and snoring.   Endocrine: Negative for heat intolerance and polyphagia.  Hematologic/Lymphatic: Negative for bleeding problem. Does not bruise/bleed easily.  Skin: Negative for flushing, nail changes, rash and suspicious lesions.  Musculoskeletal: Negative for arthritis, joint pain, muscle cramps, myalgias, neck pain and stiffness.  Gastrointestinal: Negative for abdominal pain, bowel incontinence, diarrhea and excessive appetite.  Genitourinary: Negative for decreased libido, genital sores and incomplete emptying.  Neurological: Negative for brief paralysis, focal weakness, headaches and loss of balance.  Psychiatric/Behavioral: Negative for altered mental status, depression and suicidal ideas.  Allergic/Immunologic: Negative for HIV exposure and persistent infections.    EKGs/Labs/Other Studies Reviewed:    The following studies were reviewed today:   EKG:  None today  Echo 03/21/2019    1. Left ventricular ejection fraction, by estimation, is 55 to 60%. The  left ventricle has normal function. The left ventricle has no regional  wall motion abnormalities. The left ventricular internal cavity size was  moderately dilated. Left ventricular  diastolic parameters were normal.   2. Right ventricular systolic function is normal. The right ventricular  size is normal.   3. The mitral valve is normal in structure. No evidence of mitral valve  regurgitation. No evidence of mitral stenosis.   4. The aortic valve is tricuspid. Aortic valve regurgitation is not  visualized. No aortic stenosis is present.   5. The inferior vena cava is normal in size with greater than 50%  respiratory variability, suggesting right atrial pressure of 3 mmHg.   FINDINGS   Left Ventricle: Left ventricular ejection fraction, by estimation, is 55  to 60%. The left ventricle has normal function. The left ventricle has no  regional wall motion abnormalities. The left ventricular  internal cavity  size was moderately dilated.  There is no left ventricular hypertrophy. Left ventricular diastolic  parameters were normal. Normal left ventricular filling pressure.   Right Ventricle: The right ventricular size is normal. No increase in  right ventricular wall thickness. Right ventricular systolic function is  normal.   Left Atrium: Left atrial size was normal in size.   Right Atrium: Right atrial size was normal in size.   Pericardium: There is no evidence of pericardial effusion.   Mitral Valve: The mitral valve is normal in structure. No evidence of  mitral valve regurgitation. No evidence of mitral valve stenosis.   Tricuspid Valve: The tricuspid valve is normal in structure. Tricuspid  valve regurgitation is trivial. No evidence of tricuspid stenosis.   Aortic Valve: The aortic valve is tricuspid. Aortic valve regurgitation is  not visualized. No aortic stenosis is present. Aortic valve mean gradient  measures 4.0 mmHg. Aortic valve peak gradient measures 6.6 mmHg. Aortic  valve area, by VTI measures 2.95  cm.  Pulmonic Valve: The pulmonic valve was normal in structure. Pulmonic valve  regurgitation is not visualized. No evidence of pulmonic stenosis.   Aorta: The aortic root, ascending aorta, aortic arch and descending aorta  are all structurally normal, with no evidence of dilitation or  obstruction.   Venous: The inferior vena cava is normal in size with greater than 50%  respiratory variability, suggesting right atrial pressure of 3 mmHg.   IAS/Shunts: No atrial level shunt detected by color flow Doppler.      LEFT VENTRICLE   CT cardiac scoring 12/27/2020 CLINICAL DATA:  Cardiovascular Disease Risk stratification  EXAM: Coronary Calcium Score  TECHNIQUE: A gated, non-contrast computed tomography scan of the heart was performed using 74mm slice thickness. Axial images were analyzed on a dedicated workstation. Calcium scoring of the coronary  arteries was performed using the Agatston method.   FINDINGS: Coronary Calcium Score:   Left main: 0   Left anterior descending artery: 0   Left circumflex artery: 0   Right coronary artery: 0   Total: 0   Percentile: 0   Pericardium: Normal.   Ascending Aorta: Normal caliber.   Non-cardiac: See separate report from [facility].   IMPRESSION: Coronary calcium score of 0. This was 0 percentile for age-, race-, and sex-matched controls.   RECOMMENDATIONS: Coronary artery calcium (CAC) score is a strong predictor of incident coronary heart disease (CHD) and provides predictive information beyond traditional risk factors. CAC scoring is reasonable to use in the decision to withhold, postpone, or initiate statin therapy in intermediate-risk or selected borderline-risk asymptomatic adults (age 62-75 years and LDL-C >=70 to <190 mg/dL) who do not have diabetes or established atherosclerotic cardiovascular disease (ASCVD).* In intermediate-risk (10-year ASCVD risk >=7.5% to <20%) adults or selected borderline-risk (10-year ASCVD risk >=5% to <7.5%) adults in whom a CAC score is measured for the purpose of making a treatment decision the following recommendations have been made:   If CAC=0, it is reasonable to withhold statin therapy and reassess in 5 to 10 years, as long as higher risk conditions are absent (diabetes mellitus, family history of premature CHD in first degree relatives (males <55 years; females <65 years), cigarette smoking, or LDL >=190 mg/dL).   If CAC is 1 to 99, it is reasonable to initiate statin therapy for patients >=5 years of age.   If CAC is >=100 or >=75th percentile, it is reasonable to initiate statin therapy at any age.   Cardiology referral should be considered for patients with CAC scores >=400 or >=75th percentile.   *2018 AHA/ACC/AACVPR/AAPA/ABC/ACPM/ADA/AGS/APhA/ASPC/NLA/PCNA Guideline on the Management of Blood  Cholesterol: A Report of the American College of Cardiology/American Heart Association Task Force on Clinical Practice Guidelines. J Am Coll Cardiol. 2019;73(24):3168-3209.   Olga Millers, MD Electronically Signed   Zio monitor 01/2020 Cardiology Laboratory  [facility] Name: Alex Ramirez    Age: 49 Y  MRN: [id number]   DOB: [birth year]  Beginning Study Date: 01/02/20-01/13/20                              Cardiac Monitor Report    Reason for Study:  Second degree AVB  Type of cardiac monitor:  Zio Patch  Cardiac monitor was worn for 10 days and 18 hours.     Interpretation Summary   1. Recording quality: Fair  2. Sinus Rhythm was observed.  Average HR 88, Minimum 57, Maximum  171.  3. There were  no pauses.         Second degree AVB mobitz type I was present.  Wenkebach was  detected within +/- 45 seconds of symptomatic patient event.       AV Block (2nd Mobitz II, 3rd) was not present.  4. Supraventricular Ectopic (SVE) beat burden was: <1.0%      There were no episodes of SVT.  5. Atrial fibrillation and/or flutter was not present.  6. PVC burden was: <1.0%       There were no episodes of wide complex tachycardia.  7. There were 5 patient triggered events which show Sinus and  SVEs.      There were 7 patient symptom episodes which correlate with  sinus, wenckebach, SVEs and VEs.   IMPRESSION:  Results demonstrate predominantly sinus rhythm with episodes of  symptomatic second degree AVB mobitz type I (wenkebach).      I have personally reviewed the results of this test and agree  with the report above.   Jana Half, MD  Assistant Professor (Clinical), Cardiac Electrophysiology  01/26/2020 Specimen Collected: 01/26/20 14:34    Recent Labs: No results found for requested labs within last 8760 hours.  Recent Lipid Panel No results found for: CHOL, TRIG, HDL, CHOLHDL, VLDL, LDLCALC, LDLDIRECT  Physical Exam:    VS:  BP 120/78 (BP Location:  Left Arm)   Pulse 71   Ht 6\' 2"  (1.88 m)   Wt 278 lb 6.4 oz (126.3 kg)   SpO2 99%   BMI 35.74 kg/m     Wt Readings from Last 3 Encounters:  02/24/21 278 lb 6.4 oz (126.3 kg)  01/10/21 287 lb (130.2 kg)  03/26/20 260 lb (117.9 kg)     GEN: Well nourished, well developed in no acute distress HEENT: Normal NECK: No JVD; No carotid bruits LYMPHATICS: No lymphadenopathy CARDIAC: S1S2 noted,RRR, no murmurs, rubs, gallops RESPIRATORY:  Clear to auscultation without rales, wheezing or rhonchi  ABDOMEN: Soft, non-tender, non-distended, +bowel sounds, no guarding. EXTREMITIES: No edema, No cyanosis, no clubbing MUSCULOSKELETAL:  No deformity  SKIN: Warm and dry NEUROLOGIC:  Alert and oriented x 3, non-focal PSYCHIATRIC:  Normal affect, good insight  ASSESSMENT:    1. Benign essential hypertension   2. Mixed hyperlipidemia   3. Obesity (BMI 30-39.9)    PLAN:     He appears to be doing well from a cardiovascular standpoint.  We talked about his echocardiogram result.  Unfortunately his monitor has not processed.  Thankfully he has not had any symptoms since I last saw the patient.   No medication changes.  Once this monitor is uploaded for my review I will call the patient with the information.  Blood pressure is acceptable, continue with current antihypertensive regimen.  The patient is in agreement with the above plan. The patient left the office in stable condition.  The patient will follow up as needed.   Medication Adjustments/Labs and Tests Ordered: Current medicines are reviewed at length with the patient today.  Concerns regarding medicines are outlined above.  No orders of the defined types were placed in this encounter.  No orders of the defined types were placed in this encounter.   Patient Instructions  Medication Instructions:  Your physician recommends that you continue on your current medications as directed. Please refer to the Current Medication list given to  you today. *If you need a refill on your cardiac medications before your next appointment, please call your pharmacy*   Lab Work: None If  you have labs (blood work) drawn today and your tests are completely normal, you will receive your results only by: MyChart Message (if you have MyChart) OR A paper copy in the mail If you have any lab test that is abnormal or we need to change your treatment, we will call you to review the results.   Testing/Procedures: None   Follow-Up: At [facility], you and your health needs are our priority.  As part of our continuing mission to provide you with exceptional heart care, we have created designated Provider Care Teams.  These Care Teams include your primary Cardiologist (physician) and Advanced Practice Providers (APPs -  Physician Assistants and Nurse Practitioners) who all work together to provide you with the care you need, when you need it.  We recommend signing up for the patient portal called "MyChart".  Sign up information is provided on this After Visit Summary.  MyChart is used to connect with patients for Virtual Visits (Telemedicine).  Patients are able to view lab/test results, encounter notes, upcoming appointments, etc.  Non-urgent messages can be sent to your provider as well.   To learn more about what you can do with MyChart, go to [email or web address].    Your next appointment:   As needed  The format for your next appointment:   In Person  Provider:   Thomasene Ripple, DO [address], [place], [place] [address]    Other Instructions     Adopting a Healthy Lifestyle.  Know what a healthy weight is for you (roughly BMI <25) and aim to maintain this   Aim for 7+ servings of fruits and vegetables daily   65-80+ fluid ounces of water or unsweet tea for healthy kidneys   Limit to max 1 drink of alcohol per day; avoid smoking/tobacco   Limit animal fats in diet for cholesterol and heart health - choose grass fed  whenever available   Avoid highly processed foods, and foods high in saturated/trans fats   Aim for low stress - take time to unwind and care for your mental health   Aim for 150 min of moderate intensity exercise weekly for heart health, and weights twice weekly for bone health   Aim for 7-9 hours of sleep daily   When it comes to diets, agreement about the perfect plan isnt easy to find, even among the experts. Experts at the [facility] [employer] developed an idea known as the Healthy Eating Plate. Just imagine a plate divided into logical, healthy portions.   The emphasis is on diet quality:   Load up on vegetables and fruits - one-half of your plate: Aim for color and variety, and remember that potatoes dont count.   Go for whole grains - one-quarter of your plate: Whole wheat, barley, wheat berries, quinoa, oats, brown rice, and foods made with them. If you want pasta, go with whole wheat pasta.   Protein power - one-quarter of your plate: Fish, chicken, beans, and nuts are all healthy, versatile protein sources. Limit red meat.   The diet, however, does go beyond the plate, offering a few other suggestions.   Use healthy plant oils, such as olive, canola, soy, corn, sunflower and peanut. Check the labels, and avoid partially hydrogenated oil, which have unhealthy trans fats.   If youre thirsty, drink water. Coffee and tea are good in moderation, but skip sugary drinks and limit milk and dairy products to one or two daily servings.   The type of carbohydrate  in the diet is more important than the amount. Some sources of carbohydrates, such as vegetables, fruits, whole grains, and beans-are healthier than others.   Finally, stay active  Signed, Thomasene Ripple, DO  02/24/2021 9:54 AM    [facility]

## 2021-02-24 NOTE — Patient Instructions (Signed)
Medication Instructions:  Your physician recommends that you continue on your current medications as directed. Please refer to the Current Medication list given to you today.  *If you need a refill on your cardiac medications before your next appointment, please call your pharmacy*   Lab Work: None If you have labs (blood work) drawn today and your tests are completely normal, you will receive your results only by: MyChart Message (if you have MyChart) OR A paper copy in the mail If you have any lab test that is abnormal or we need to change your treatment, we will call you to review the results.   Testing/Procedures: None   Follow-Up: At [facility], you and your health needs are our priority.  As part of our continuing mission to provide you with exceptional heart care, we have created designated Provider Care Teams.  These Care Teams include your primary Cardiologist (physician) and Advanced Practice Providers (APPs -  Physician Assistants and Nurse Practitioners) who all work together to provide you with the care you need, when you need it.  We recommend signing up for the patient portal called "MyChart".  Sign up information is provided on this After Visit Summary.  MyChart is used to connect with patients for Virtual Visits (Telemedicine).  Patients are able to view lab/test results, encounter notes, upcoming appointments, etc.  Non-urgent messages can be sent to your provider as well.   To learn more about what you can do with MyChart, go to [email or web address].    Your next appointment:    As needed  The format for your next appointment:   In Person  Provider:   Kardie Tobb, DO [address], [place], [place] [address]    Other Instructions   

## 2021-03-02 ENCOUNTER — Other Ambulatory Visit: Payer: Self-pay

## 2021-03-02 DIAGNOSIS — I441 Atrioventricular block, second degree: Secondary | ICD-10-CM

## 2021-03-02 NOTE — Progress Notes (Signed)
Referral made for EP.

## 2021-03-08 NOTE — Progress Notes (Signed)
Electrophysiology Office Note:    Date:  03/09/2021   ID:  Drexel Ivey, DOB 04/24/1976, MRN [id number]  PCP:  Andi Devon, MD  [facility] Cardiologist:  Thomasene Ripple, DO  [facility] Electrophysiologist:  Lanier Prude, MD   Referring MD: Thomasene Ripple, DO   Chief Complaint: 2nd degree AV block  History of Present Illness:    Efstathios Sawin is a 45 y.o. male who presents for an evaluation of 2nd degree AV block at the request of Dr Servando Salina. Their medical history includes MS. The patient last saw Dr Servando Salina 02/24/2021. A heart monitor was ordered and showed 2nd degree type I av block (wenckebach). He was asymptomatic at the time of the wenckebach conduction.   No syncope or presyncope.  He does have sleep apnea and wears a CPAP.   Past Medical History:  Diagnosis Date   MS (multiple sclerosis) (HCC)     Past Surgical History:  Procedure Laterality Date   KNEE SURGERY Right    x 4, ACL    Current Medications: Current Meds  Medication Sig   amphetamine-dextroamphetamine (ADDERALL) 10 MG tablet Take 10 mg by mouth daily.   bacitracin ointment Apply 1 application topically 2 (two) times daily.   baclofen (LIORESAL) 10 MG tablet Take 10 mg by mouth 3 (three) times daily.   buPROPion (WELLBUTRIN SR) 150 MG 12 hr tablet Take 150 mg by mouth 2 (two) times daily.   busPIRone (BUSPAR) 5 MG tablet Take 5 mg by mouth 2 (two) times daily.   Dimethyl Fumarate 120 MG CPDR 1 capsule in the morning and at bedtime.   ezetimibe (ZETIA) 10 MG tablet Take 10 mg by mouth daily.   gabapentin (NEURONTIN) 600 MG tablet Take 600 mg by mouth 3 (three) times daily.   hydrochlorothiazide (HYDRODIURIL) 12.5 MG tablet Take 12.5 mg by mouth daily.   lamoTRIgine (LAMICTAL) 100 MG tablet Take 100 mg by mouth 2 (two) times daily.   terazosin (HYTRIN) 5 MG capsule 5 mg 2 (two) times daily.     Allergies:   Other and Oxycodone   Social History   Socioeconomic History   Marital status: Married     Spouse name: Not on file   Number of children: Not on file   Years of education: Not on file   Highest education level: Not on file  Occupational History   Not on file  Tobacco Use   Smoking status: Former    Types: Cigarettes    Passive exposure: Past   Smokeless tobacco: Never  Vaping Use   Vaping Use: Never used  Substance and Sexual Activity   Alcohol use: Yes    Comment: couple times weekly   Drug use: Never   Sexual activity: Not on file  Other Topics Concern   Not on file  Social History Narrative   Not on file   Social Determinants of Health   Financial Resource Strain: Not on file  Food Insecurity: Not on file  Transportation Needs: Not on file  Physical Activity: Not on file  Stress: Not on file  Social Connections: Not on file     Family History: The patient's family history includes Breast cancer in his maternal grandmother; Congestive Heart Failure in his father and mother; Diabetes in his brother, father, mother, paternal grandfather, paternal grandmother, sister, and sister; Healthy in his brother, brother, brother, brother, sister, sister, sister, sister, sister, and sister; Heart attack (age of onset: 78) in his father; High Cholesterol in his  brother and father; Hypertension in his mother and paternal grandfather.  ROS:   Please see the history of present illness.    All other systems reviewed and are negative.  EKGs/Labs/Other Studies Reviewed:    The following studies were reviewed today:  02/28/2021 Zio Rare PAC/PVC No AF Occasional wenckebach conduction   EKG:  The ekg ordered today demonstrates sinus rhythm.  PR interval 214 ms.  Other intervals are normal.   Recent Labs: No results found for requested labs within last 8760 hours.  Recent Lipid Panel No results found for: CHOL, TRIG, HDL, CHOLHDL, VLDL, LDLCALC, LDLDIRECT  Physical Exam:    VS:  BP 140/80   Pulse 73   Ht 6\' 2"  (1.88 m)   Wt 283 lb (128.4 kg)   SpO2 97%   BMI  36.34 kg/m     Wt Readings from Last 3 Encounters:  03/09/21 283 lb (128.4 kg)  02/24/21 278 lb 6.4 oz (126.3 kg)  01/10/21 287 lb (130.2 kg)     GEN:  Well nourished, well developed in no acute distress HEENT: Normal NECK: No JVD; No carotid bruits LYMPHATICS: No lymphadenopathy CARDIAC: RRR, no murmurs, rubs, gallops RESPIRATORY:  Clear to auscultation without rales, wheezing or rhonchi  ABDOMEN: Soft, non-tender, non-distended MUSCULOSKELETAL:  No edema; No deformity  SKIN: Warm and dry NEUROLOGIC:  Alert and oriented x 3 PSYCHIATRIC:  Normal affect       ASSESSMENT:    1. Second degree AV block   2. Palpitations    PLAN:    In order of problems listed above:  #Second-degree AV block type I, Wenckebach Asymptomatic.  Likely related to heightened vagal tone.  He does have sleep apnea which likely contributes.  I do not think further testing or treatment is indicated at this time.  We had a discussion during today's appointment about the pathophysiology of Wenckebach conduction.  Follow-up with EP on an as-needed basis.     Medication Adjustments/Labs and Tests Ordered: Current medicines are reviewed at length with the patient today.  Concerns regarding medicines are outlined above.  Orders Placed This Encounter  Procedures   EKG 12-Lead   No orders of the defined types were placed in this encounter.    Signed, 03/12/21. Rossie Muskrat, MD, [facility], [facility] 03/09/2021 9:29 AM    Electrophysiology [facility]

## 2021-03-09 ENCOUNTER — Ambulatory Visit (INDEPENDENT_AMBULATORY_CARE_PROVIDER_SITE_OTHER): Payer: 59 | Admitting: Cardiology

## 2021-03-09 ENCOUNTER — Encounter: Payer: Self-pay | Admitting: Cardiology

## 2021-03-09 ENCOUNTER — Other Ambulatory Visit: Payer: Self-pay

## 2021-03-09 VITALS — BP 140/80 | HR 73 | Ht 74.0 in | Wt 283.0 lb

## 2021-03-09 DIAGNOSIS — I441 Atrioventricular block, second degree: Secondary | ICD-10-CM

## 2021-03-09 DIAGNOSIS — R002 Palpitations: Secondary | ICD-10-CM | POA: Diagnosis not present

## 2021-03-09 NOTE — Patient Instructions (Addendum)
Medication Instructions:  Your physician recommends that you continue on your current medications as directed. Please refer to the Current Medication list given to you today. *If you need a refill on your cardiac medications before your next appointment, please call your pharmacy*  Lab Work: None ordered. If you have labs (blood work) drawn today and your tests are completely normal, you will receive your results only by: MyChart Message (if you have MyChart) OR A paper copy in the mail If you have any lab test that is abnormal or we need to change your treatment, we will call you to review the results.  Testing/Procedures: None ordered.  Follow-Up: At [facility], you and your health needs are our priority.  As part of our continuing mission to provide you with exceptional heart care, we have created designated Provider Care Teams.  These Care Teams include your primary Cardiologist (physician) and Advanced Practice Providers (APPs -  Physician Assistants and Nurse Practitioners) who all work together to provide you with the care you need, when you need it.  Your next appointment:   Your physician wants you to follow-up in: as needed   

## 2021-07-01 ENCOUNTER — Other Ambulatory Visit: Payer: Self-pay | Admitting: Neurology

## 2021-07-01 DIAGNOSIS — G35 Multiple sclerosis: Secondary | ICD-10-CM

## 2021-07-01 DIAGNOSIS — G35D Multiple sclerosis, unspecified: Secondary | ICD-10-CM

## 2021-07-12 ENCOUNTER — Ambulatory Visit: Payer: 59 | Admitting: Cardiology

## 2021-07-15 ENCOUNTER — Other Ambulatory Visit: Payer: Self-pay | Admitting: Neurology

## 2021-07-15 ENCOUNTER — Ambulatory Visit
Admission: RE | Admit: 2021-07-15 | Discharge: 2021-07-15 | Disposition: A | Discharge status: Home / Self Care | Payer: 59 | Source: Ambulatory Visit | Attending: Neurology | Admitting: Neurology

## 2021-07-15 DIAGNOSIS — G35 Multiple sclerosis: Secondary | ICD-10-CM

## 2021-07-15 DIAGNOSIS — G35D Multiple sclerosis, unspecified: Secondary | ICD-10-CM

## 2021-07-15 MED ORDER — GADOBENATE DIMEGLUMINE 529 MG/ML IV SOLN
20.0000 mL | Freq: Once | INTRAVENOUS | Status: AC | PRN
  Administered 2021-07-15: 20 mL via INTRAVENOUS

## 2021-07-18 ENCOUNTER — Ambulatory Visit
Admission: RE | Admit: 2021-07-18 | Discharge: 2021-07-18 | Disposition: A | Discharge status: Home / Self Care | Payer: 59 | Source: Ambulatory Visit | Attending: Neurology | Admitting: Neurology

## 2021-07-18 ENCOUNTER — Other Ambulatory Visit: Payer: Self-pay

## 2021-07-18 ENCOUNTER — Other Ambulatory Visit: Payer: 59

## 2021-07-18 DIAGNOSIS — G35D Multiple sclerosis, unspecified: Secondary | ICD-10-CM

## 2021-07-18 DIAGNOSIS — G35 Multiple sclerosis: Secondary | ICD-10-CM

## 2021-07-18 MED ORDER — GADOBENATE DIMEGLUMINE 529 MG/ML IV SOLN
20.0000 mL | Freq: Once | INTRAVENOUS | Status: AC | PRN
  Administered 2021-07-18: 20 mL via INTRAVENOUS

## 2021-11-17 ENCOUNTER — Other Ambulatory Visit: Payer: Self-pay

## 2021-11-17 ENCOUNTER — Emergency Department (HOSPITAL_COMMUNITY)
Admission: EM | Admit: 2021-11-17 | Discharge: 2021-11-17 | Disposition: A | Discharge status: Home / Self Care | Payer: 59 | Attending: Emergency Medicine | Admitting: Emergency Medicine

## 2021-11-17 ENCOUNTER — Emergency Department (HOSPITAL_COMMUNITY): Payer: 59

## 2021-11-17 ENCOUNTER — Encounter (HOSPITAL_COMMUNITY): Payer: Self-pay

## 2021-11-17 DIAGNOSIS — M542 Cervicalgia: Secondary | ICD-10-CM | POA: Insufficient documentation

## 2021-11-17 DIAGNOSIS — R7401 Elevation of levels of liver transaminase levels: Secondary | ICD-10-CM | POA: Diagnosis not present

## 2021-11-17 DIAGNOSIS — R519 Headache, unspecified: Secondary | ICD-10-CM | POA: Diagnosis present

## 2021-11-17 LAB — COMPREHENSIVE METABOLIC PANEL
ALT: 87 U/L — ABNORMAL HIGH (ref 0–44)
AST: 178 U/L — ABNORMAL HIGH (ref 15–41)
Albumin: 3.5 g/dL (ref 3.5–5.0)
Alkaline Phosphatase: 31 U/L — ABNORMAL LOW (ref 38–126)
Anion gap: 12 (ref 5–15)
BUN: 16 mg/dL (ref 6–20)
CO2: 26 mmol/L (ref 22–32)
Calcium: 9.2 mg/dL (ref 8.9–10.3)
Chloride: 97 mmol/L — ABNORMAL LOW (ref 98–111)
Creatinine, Ser: 1.5 mg/dL — ABNORMAL HIGH (ref 0.61–1.24)
GFR, Estimated: 59 mL/min — ABNORMAL LOW (ref >60)
Glucose, Bld: 78 mg/dL (ref 70–99)
Potassium: 3.6 mmol/L (ref 3.5–5.1)
Sodium: 135 mmol/L (ref 135–145)
Total Bilirubin: 0.6 mg/dL (ref 0.3–1.2)
Total Protein: 6.9 g/dL (ref 6.5–8.1)

## 2021-11-17 LAB — I-STAT CHEM 8, ED
BUN: 18 mg/dL (ref 6–20)
Calcium, Ion: 1.14 mmol/L — ABNORMAL LOW (ref 1.15–1.40)
Chloride: 97 mmol/L — ABNORMAL LOW (ref 98–111)
Creatinine, Ser: 1.8 mg/dL — ABNORMAL HIGH (ref 0.61–1.24)
Glucose, Bld: 74 mg/dL (ref 70–99)
HCT: 40 % (ref 39.0–52.0)
Hemoglobin: 13.6 g/dL (ref 13.0–17.0)
Potassium: 3.6 mmol/L (ref 3.5–5.1)
Sodium: 136 mmol/L (ref 135–145)
TCO2: 30 mmol/L (ref 22–32)

## 2021-11-17 LAB — CBC WITH DIFFERENTIAL/PLATELET
Abs Immature Granulocytes: 0.02 10*3/uL (ref 0.00–0.07)
Basophils Absolute: 0 10*3/uL (ref 0.0–0.1)
Basophils Relative: 0 %
Eosinophils Absolute: 0.3 10*3/uL (ref 0.0–0.5)
Eosinophils Relative: 3 %
HCT: 36.3 % — ABNORMAL LOW (ref 39.0–52.0)
Hemoglobin: 11.7 g/dL — ABNORMAL LOW (ref 13.0–17.0)
Immature Granulocytes: 0 %
Lymphocytes Relative: 21 %
Lymphs Abs: 1.9 10*3/uL (ref 0.7–4.0)
MCH: 25.9 pg — ABNORMAL LOW (ref 26.0–34.0)
MCHC: 32.2 g/dL (ref 30.0–36.0)
MCV: 80.3 fL (ref 80.0–100.0)
Monocytes Absolute: 1 10*3/uL (ref 0.1–1.0)
Monocytes Relative: 11 %
Neutro Abs: 5.7 10*3/uL (ref 1.7–7.7)
Neutrophils Relative %: 65 %
Platelets: 461 10*3/uL — ABNORMAL HIGH (ref 150–400)
RBC: 4.52 MIL/uL (ref 4.22–5.81)
RDW: 14.9 % (ref 11.5–15.5)
WBC: 9 10*3/uL (ref 4.0–10.5)
nRBC: 0 % (ref 0.0–0.2)

## 2021-11-17 MED ORDER — ACETAMINOPHEN 325 MG PO TABS: 650.0000 mg | ORAL_TABLET | Freq: Once | ORAL | Status: DC

## 2021-11-17 MED ORDER — IOHEXOL 350 MG/ML SOLN
60.0000 mL | Freq: Once | INTRAVENOUS | Status: AC | PRN
  Administered 2021-11-17: 60 mL via INTRAVENOUS

## 2021-11-17 MED ORDER — IBUPROFEN 400 MG PO TABS: 600.0000 mg | ORAL_TABLET | Freq: Once | ORAL | Status: DC

## 2021-11-17 NOTE — ED Provider Notes (Signed)
[facility] EMERGENCY DEPARTMENT Provider Note   CSN: [id number] Arrival date & time: 11/17/21  1651     History  Chief Complaint  Patient presents with   Headache    Alex Ramirez is a 46 y.o. male.  Patient presents to ER chief complaint of left-sided neck pain.  This occurred while working out he states he was standing up doing curls when he had sudden onset left-sided neck pain and a sensation of warmth spreading across the left side of the face to the left side of the head.  Symptoms lasted for about 2 to 3 hours and now have cooled down he states.  Otherwise denies any loss of consciousness denies any numbness or weakness.  Denies fevers cough vomiting or diarrhea.       Home Medications Prior to Admission medications   Medication Sig Start Date End Date Taking? Authorizing Provider  amphetamine-dextroamphetamine (ADDERALL) 10 MG tablet Take 10 mg by mouth daily. 11/21/20   [provider]  bacitracin ointment Apply 1 application topically 2 (two) times daily. 03/26/20   Henderly, Britni A, PA-C  baclofen (LIORESAL) 10 MG tablet Take 10 mg by mouth 3 (three) times daily. 04/01/20   [provider]  buPROPion (WELLBUTRIN SR) 150 MG 12 hr tablet Take 150 mg by mouth 2 (two) times daily. 04/18/20   [provider]  busPIRone (BUSPAR) 5 MG tablet Take 5 mg by mouth 2 (two) times daily. 12/20/20   [provider]  Dimethyl Fumarate 120 MG CPDR 1 capsule in the morning and at bedtime. 05/05/20   [provider]  ezetimibe (ZETIA) 10 MG tablet Take 10 mg by mouth daily. 12/20/20   [provider]  gabapentin (NEURONTIN) 600 MG tablet Take 600 mg by mouth 3 (three) times daily. 05/04/20   [provider]  hydrochlorothiazide (HYDRODIURIL) 12.5 MG tablet Take 12.5 mg by mouth daily. 03/28/20   [provider]  lamoTRIgine (LAMICTAL) 100 MG tablet Take 100 mg by mouth 2 (two) times daily. 04/01/20    [provider]  terazosin (HYTRIN) 5 MG capsule 5 mg 2 (two) times daily. 03/08/20   [provider]      Allergies    Other and Oxycodone    Review of Systems   Review of Systems  Constitutional:  Negative for fever.  HENT:  Negative for ear pain and sore throat.   Eyes:  Negative for pain.  Respiratory:  Negative for cough.   Cardiovascular:  Negative for chest pain.  Gastrointestinal:  Negative for abdominal pain.  Genitourinary:  Negative for flank pain.  Musculoskeletal:  Negative for back pain.  Skin:  Negative for color change and rash.  Neurological:  Positive for headaches. Negative for syncope.  All other systems reviewed and are negative.   Physical Exam Updated Vital Signs BP (!) 148/86   Pulse 75   Temp 98.6 F (37 C) (Oral)   Resp 16   Ht 6\' 2"  (1.88 m)   Wt 122.5 kg   SpO2 99%   BMI 34.67 kg/m  Physical Exam Constitutional:      Appearance: He is well-developed.  HENT:     Head: Normocephalic.     Nose: Nose normal.  Eyes:     Extraocular Movements: Extraocular movements intact.  Cardiovascular:     Rate and Rhythm: Normal rate.  Pulmonary:     Effort: Pulmonary effort is normal.  Skin:    Coloration: Skin is not jaundiced.  Neurological:     General: No focal deficit present.     Mental Status: He is alert and oriented to person, place, and time. Mental status is at baseline.     Cranial Nerves: No cranial nerve deficit.     Motor: No weakness.     Gait: Gait normal.     Comments: No focal weakness on exam.  No carotid bruits noted.  5/5 strength all extremities.     ED Results / Procedures / Treatments   Labs (all labs ordered are listed, but only abnormal results are displayed) Labs Reviewed  COMPREHENSIVE METABOLIC PANEL - Abnormal; Notable for the following components:      Result Value   Chloride 97 (*)    Creatinine, Ser 1.50 (*)    AST 178 (*)    ALT 87 (*)    Alkaline Phosphatase 31 (*)    GFR, Estimated 59  (*)    All other components within normal limits  CBC WITH DIFFERENTIAL/PLATELET - Abnormal; Notable for the following components:   Hemoglobin 11.7 (*)    HCT 36.3 (*)    MCH 25.9 (*)    Platelets 461 (*)    All other components within normal limits  I-STAT CHEM 8, ED - Abnormal; Notable for the following components:   Chloride 97 (*)    Creatinine, Ser 1.80 (*)    Calcium, Ion 1.14 (*)    All other components within normal limits    EKG None  Radiology CT ANGIO HEAD NECK W WO CM  Result Date: 11/17/2021 CLINICAL DATA:  Headache, sudden, severe. Tearing feeling in the left side of the neck while working out. Personal history of multiple sclerosis. EXAM: CT ANGIOGRAPHY HEAD AND NECK TECHNIQUE: Multidetector CT imaging of the head and neck was performed using the standard protocol during bolus administration of intravenous contrast. Multiplanar CT image reconstructions and MIPs were obtained to evaluate the vascular anatomy. Carotid stenosis measurements (when applicable) are obtained utilizing NASCET criteria, using the distal internal carotid diameter as the denominator. RADIATION DOSE REDUCTION: This exam was performed according to the departmental dose-optimization program which includes automated exposure control, adjustment of the mA and/or kV according to patient size and/or use of iterative reconstruction technique. CONTRAST:  81mL OMNIPAQUE IOHEXOL 350 MG/ML SOLN COMPARISON:  MRI of the head and cervical spine 07/15/2021. FINDINGS: CT HEAD FINDINGS Brain: No acute infarct, hemorrhage, or mass lesion is present. Subarachnoid spaces the left frontal lobe stable. No other significant extracranial fluid collection is present. The ventricles are of normal size. No significant white matter lesions are present. The brainstem and cerebellum are within normal limits. Vascular: No hyperdense vessel or unexpected calcification. Skull: Calvarium is intact. No focal lytic or blastic lesions are  present. No significant extracranial soft tissue lesion is present. Sinuses: The paranasal sinuses and mastoid air cells are clear. Orbits: The globes and orbits are within normal limits. Review of the MIP images confirms the above findings CTA NECK FINDINGS Aortic arch: 3 vessel arch configuration is present. No significant atherosclerotic change or stenosis is present. Right carotid system: The right common carotid artery is within normal limits. Bifurcation is unremarkable. Cervical right ICA is normal. Left carotid system: The left common carotid artery is within normal limits. Bifurcation is unremarkable. The cervical left ICA is normal. Vertebral arteries: The right vertebral artery is slightly dominant. Both vertebral arteries originate from the subclavian arteries without significant stenosis. No significant stenosis is present in either vertebral artery in neck. Skeleton:  Vertebral body heights and alignment are normal. No focal osseous lesions are present. Other neck: Soft tissues the neck are otherwise unremarkable. Salivary glands are within normal limits. Thyroid is normal. No significant adenopathy is present. No focal mucosal or submucosal lesions are present. Upper chest: The lung apices are clear. Thoracic inlet is within normal limits. Review of the MIP images confirms the above findings CTA HEAD FINDINGS Anterior circulation: The internal carotid arteries are within normal limits from the skull base through the ICA termini. The A1 and M1 segments are normal. The anterior communicating artery is patent. MCA bifurcations are intact. ACA and MCA branch vessels are normal. Posterior circulation: The right vertebral artery is the dominant vessel. PICA origins are visualized near the dural margin. The vertebrobasilar junction and basilar artery is normal. The right posterior cerebral artery originates from basilar tip. The left posterior cerebral artery is of fetal type with a small left P1 segment  contributing. Venous sinuses: The dural sinuses are patent. The straight sinus deep cerebral veins are intact. Cortical veins are within normal limits. No significant vascular malformation is evident. The right transverse sinus is dominant. Anatomic variants: Fetal type left posterior cerebral artery. Review of the MIP images confirms the above findings IMPRESSION: 1. Negative CTA of the neck.  No vascular injury. 2. Normal CTA circle-of-Willis without significant proximal stenosis, aneurysm, or branch vessel occlusion. Electronically Signed   By: San Morelle M.D.   On: 11/17/2021 19:47    Procedures Procedures    Medications Ordered in ED Medications  iohexol (OMNIPAQUE) 350 MG/ML injection 60 mL (60 mLs Intravenous Contrast Given 11/17/21 1917)    ED Course/ Medical Decision Making/ A&P Clinical Course as of 11/17/21 2130  Fri Nov 17, 2021  1857 IV started and labs drawn by triage RN.  Phlebotomist is going to run i-STAT Chem-8.  I called CT scan, they are holding CT scanner 1 for patient. [EH]  1952 CT ANGIO HEAD NECK W WO CM [EH]  1952 I had called [facility] radiology prior to images being obtained to asked them to prioritize reading images as patient does not meet code stroke criteria. CTAs of head and neck without evidence of dissection per radiologist. Patient will wait in the waiting room for the room in the back. [EH]    Clinical Course User Index [EH] Lorin Glass, PA-C                           Medical Decision Making  Review of records shows telemedicine visit for MS on July 31, 2021.  Cardiac monitoring showing sinus rhythm.  Review of results showed normal chemistry normal creatinine.  AST slightly elevated.  CT angio of the head and neck read by radiologist as no acute pathology noted.  Patient given Tylenol Motrin here in the ER, has no focal neurodeficit.  Will be discharged home in stable condition.  Advised outpatient follow-up with his doctor  within a week.  Advised immediate return for worsening symptoms or any additional concerns.        Final Clinical Impression(s) / ED Diagnoses Final diagnoses:  Acute nonintractable headache, unspecified headache type    Rx / DC Orders ED Discharge Orders     None         Luna Fuse, MD 11/17/21 2137

## 2021-11-17 NOTE — Discharge Instructions (Signed)
Call your primary care doctor or specialist as discussed in the next 2-3 days.   Return immediately back to the ER if:  Your symptoms worsen within the next 12-24 hours. You develop new symptoms such as new fevers, persistent vomiting, new pain, shortness of breath, or new weakness or numbness, or if you have any other concerns.  

## 2021-11-17 NOTE — ED Provider Triage Note (Signed)
Emergency Medicine Provider Triage Evaluation Note  Alex Ramirez , a 46 y.o. male  was evaluated in triage.  Pt complains of a warm sensation with a tear and pop on the left side of his neck while doing curls at the gym.   Then he developed a headache.  He states that his headache is continued and is on his left side. This happened about an hour PTA per patient.  No blood thinners. He does have a hx of MS.    Physical Exam  BP 138/78 (BP Location: Left Arm)   Pulse 88   Temp 98.6 F (37 C) (Oral)   Resp 18   Ht 6\' 2"  (1.88 m)   Wt 122.5 kg   SpO2 97%   BMI 34.67 kg/m  Gen:   Awake, no distress   Resp:  Normal effort  MSK:   Moves extremities without difficulty  Other:  Patient is awake and alert.  Speech is not slurred.  Facial movements are symmetric.  5/5 strength bilateral arms and legs./5 grip strength bilaterally.  Vision is grossly intact.  Coordination is grossly intact.  Medical Decision Making  Medically screening exam initiated at 6:41 PM.  Appropriate orders placed.  Alex Ramirez was informed that the remainder of the evaluation will be completed by another provider, this initial triage assessment does not replace that evaluation, and the importance of remaining in the ED until their evaluation is complete.  Patient presents today for evaluation of sudden onset of pain in the left side of his neck with associated headache during exertion. At the time of my initial evaluation he does not meet criteria for code stroke as he does not have any significant deficit.  Given his story though will move to rapidly obtain CTA head and neck.  I-STAT Chem-8 is ordered.   Betsey Amen, Cristina Gong 11/17/21 2201

## 2021-11-17 NOTE — ED Triage Notes (Signed)
Pt was working out and felt a pop in left side of neck and started having a headache.  Pt denies numbness or tingling.  Pain is radiating from left side of head down left neck.

## 2022-01-26 DIAGNOSIS — M25522 Pain in left elbow: Secondary | ICD-10-CM | POA: Insufficient documentation

## 2022-03-20 DIAGNOSIS — M25512 Pain in left shoulder: Secondary | ICD-10-CM | POA: Insufficient documentation

## 2022-03-20 DIAGNOSIS — M25511 Pain in right shoulder: Secondary | ICD-10-CM | POA: Insufficient documentation

## 2022-04-04 ENCOUNTER — Ambulatory Visit: Payer: 59 | Admitting: Family

## 2022-04-05 ENCOUNTER — Encounter: Payer: Self-pay | Admitting: Cardiology

## 2022-04-05 ENCOUNTER — Ambulatory Visit: Payer: 59 | Attending: Cardiology | Admitting: Cardiology

## 2022-04-05 VITALS — BP 144/66 | HR 97 | Ht 74.0 in | Wt 275.4 lb

## 2022-04-05 DIAGNOSIS — Z01812 Encounter for preprocedural laboratory examination: Secondary | ICD-10-CM | POA: Diagnosis not present

## 2022-04-05 DIAGNOSIS — I10 Essential (primary) hypertension: Secondary | ICD-10-CM | POA: Diagnosis not present

## 2022-04-05 DIAGNOSIS — E669 Obesity, unspecified: Secondary | ICD-10-CM

## 2022-04-05 DIAGNOSIS — R079 Chest pain, unspecified: Secondary | ICD-10-CM | POA: Diagnosis not present

## 2022-04-05 LAB — BASIC METABOLIC PANEL
BUN/Creatinine Ratio: 11 (ref 9–20)
BUN: 17 mg/dL (ref 6–24)
CO2: 26 mmol/L (ref 20–29)
Calcium: 9.2 mg/dL (ref 8.7–10.2)
Chloride: 98 mmol/L (ref 96–106)
Creatinine, Ser: 1.49 mg/dL — ABNORMAL HIGH (ref 0.76–1.27)
Glucose: 92 mg/dL (ref 70–99)
Potassium: 4.4 mmol/L (ref 3.5–5.2)
Sodium: 135 mmol/L (ref 134–144)
eGFR: 59 mL/min/{1.73_m2} — ABNORMAL LOW (ref >59)

## 2022-04-05 MED ORDER — METOPROLOL TARTRATE 100 MG PO TABS: 100.0000 mg | ORAL_TABLET | Freq: Once | ORAL | 0 refills | Status: DC

## 2022-04-05 NOTE — Patient Instructions (Addendum)
Medication Instructions:  Your physician recommends that you continue on your current medications as directed. Please refer to the Current Medication list given to you today.  *If you need a refill on your cardiac medications before your next appointment, please call your pharmacy*   Lab Work: Your physician recommends that you have the following lab drawn today: BMET  If you have labs (blood work) drawn today and your tests are completely normal, you will receive your results only by: MyChart Message (if you have MyChart) OR A paper copy in the mail If you have any lab test that is abnormal or we need to change your treatment, we will call you to review the results.   Testing/Procedures: Cardiac CT Angiography (CTA), is a special type of CT scan that uses a computer to produce multi-dimensional views of major blood vessels throughout the body. In CT angiography, a contrast material is injected through an IV to help visualize the blood vessels    Follow-Up: At [facility], you and your health needs are our priority.  As part of our continuing mission to provide you with exceptional heart care, we have created designated Provider Care Teams.  These Care Teams include your primary Cardiologist (physician) and Advanced Practice Providers (APPs -  Physician Assistants and Nurse Practitioners) who all work together to provide you with the care you need, when you need it.  We recommend signing up for the patient portal called "MyChart".  Sign up information is provided on this After Visit Summary.  MyChart is used to connect with patients for Virtual Visits (Telemedicine).  Patients are able to view lab/test results, encounter notes, upcoming appointments, etc.  Non-urgent messages can be sent to your provider as well.   To learn more about what you can do with MyChart, go to [email or web address].    Your next appointment:   6 month(s)  The format for your next appointment:   In  Person  Provider:   Thomasene Ripple, DO     Other Instructions   Your cardiac CT will be scheduled at one of the below locations:   [facility] [address] [place], [place] [address] [phone]   If scheduled at [facility], please arrive at the [facility] and Children's Entrance (Entrance C2) of [facility] 30 minutes prior to test start time. You can use the FREE valet parking offered at entrance C (encouraged to control the heart rate for the test)  Proceed to the [facility] Radiology Department (first floor) to check-in and test prep.  All radiology patients and guests should use entrance C2 at [facility], accessed from [facility], even though the hospital's physical address listed is [address]     Please follow these instructions carefully (unless otherwise directed):  Hold all erectile dysfunction medications at least 3 days (72 hrs) prior to test. (Ie viagra, cialis, sildenafil, tadalafil, etc) We will administer nitroglycerin during this exam.   On the Night Before the Test: Be sure to Drink plenty of water. Do not consume any caffeinated/decaffeinated beverages or chocolate 12 hours prior to your test. Do not take any antihistamines 12 hours prior to your test.  On the Day of the Test: Drink plenty of water until 1 hour prior to the test. Do not eat any food 1 hour prior to test. You may take your regular medications prior to the test.  Take metoprolol (Lopressor) 100mg  two hours prior to test. HOLD Hydrochlorothiazide  morning of the test.       After the Test: Drink plenty of water. After receiving IV contrast, you may experience a mild flushed feeling. This is normal. On occasion, you may experience a mild rash up to 24 hours after the test. This is not dangerous. If this occurs, you can take Benadryl 25 mg and increase your fluid intake. If you experience trouble breathing, this can be serious.  If it is severe call 911 IMMEDIATELY. If it is mild, please call our office. If you take any of these medications: Glipizide/Metformin, Avandament, Glucavance, please do not take 48 hours after completing test unless otherwise instructed.  We will call to schedule your test 2-4 weeks out understanding that some insurance companies will need an authorization prior to the service being performed.   For non-scheduling related questions, please contact the cardiac imaging nurse navigator should you have any questions/concerns: Rockwell Alexandria, Cardiac Imaging Nurse Navigator Larey Brick, Cardiac Imaging Nurse Navigator [facility] Direct Office Dial: [phone]   For scheduling needs, including cancellations and rescheduling, please call [place], [phone].   [email or web address] [email or web address]        FDA-cleared, clinical grade mobile EKG monitor: Lourena Simmonds is the most clinically-validated mobile EKG used by the world's leading cardiac care medical professionals With Basic service, know instantly if your heart rhythm is normal or if atrial fibrillation is detected, and email the last single EKG recording to yourself or your doctor Premium service, available for purchase through the Kardia app for $9.99 per month or $99 per year, includes unlimited history and storage of your EKG recordings, a monthly EKG summary report to share with your doctor, along with the ability to track your blood pressure, activity and weight Includes one KardiaMobile phone clip FREE SHIPPING: Standard delivery 1-3 business days. Orders placed by 11:00am PST will ship that afternoon. Otherwise, will ship next business day. All orders ship via [employer] from [place], [place]    [employer] - sending an EKG Download app and set up profile. Run EKG - by placing 1-2 fingers on the silver plates After EKG is complete - Download PDF  - Skip password (if you  apply a password the provider will need it to view the EKG) Click share button (square with upward arrow) in bottom left corner To send: choose MyChart (first time log into MyChart)  Pop up window about sending ECG Click continue Choose type of message Choose provider Type subject and message Click send (EKG should be attached)  - To send additional EKGs in one message click the paperclip image and bottom of page to attach.     Important Information About Sugar

## 2022-04-05 NOTE — Progress Notes (Signed)
Cardiology Office Note:    Date:  04/05/2022   ID:  Alex Ramirez, DOB February 11, 1976, MRN [id number]  PCP:  Andi Devon, MD  Cardiologist:  Thomasene Ripple, DO  Electrophysiologist:  Lanier Prude, MD   Referring MD: Andi Devon, MD   " I am dong well"  History of Present Illness:   . Alex Ramirez is a 46 y.o. male with a hx of  hypertension, hyperlipidemia, obesity, multiple sclerosis follows with neurology is here today for a follow up visit.  I saw the patient on January 10, 2021 at that time he presented to establish cardiac care.  During that visit he did tell me that he had been experiencing some palpitations.  He also had been admitted to the hospital for shortness of breath.  He underwent a coronary CT scan which showed 0 calcium.  I saw the patient in October 2022 at that time he was doing well from a cardiovascular standpoint.  No complaints.  Since I saw the patient he tells me about several weeks ago he started experience intermittent chest discomfort in radiation to his left shoulder.  He notes that he was coming out of his daughter's room when he felt this midsternal left-sided upper chest discomfort which then eventually radiated to his shoulders feel like prickly sensation.  He was able to go and do some things in his son's room but by the time he got to his bedroom he felt as if he was going to pass out.  He felt as if his heart was slowing down and eventually just picked back up.  He did not pass out thankfully.  And this has not happened again.  This is concerning for him because there is premature coronary artery disease in his family with his father dying of acute MI at the age of 76.  Past Medical History:  Diagnosis Date   MS (multiple sclerosis) (HCC)     Past Surgical History:  Procedure Laterality Date   KNEE SURGERY Right    x 4, ACL    Current Medications: Current Meds  Medication Sig   amphetamine-dextroamphetamine (ADDERALL) 10 MG tablet  Take 10 mg by mouth daily.   bacitracin ointment Apply 1 application topically 2 (two) times daily.   baclofen (LIORESAL) 10 MG tablet Take 10 mg by mouth 3 (three) times daily.   busPIRone (BUSPAR) 5 MG tablet Take 5 mg by mouth 2 (two) times daily.   Dimethyl Fumarate 120 MG CPDR 1 capsule in the morning and at bedtime.   ezetimibe (ZETIA) 10 MG tablet Take 10 mg by mouth daily.   gabapentin (NEURONTIN) 600 MG tablet Take 600 mg by mouth 3 (three) times daily.   hydrochlorothiazide (HYDRODIURIL) 12.5 MG tablet Take 12.5 mg by mouth daily.   lamoTRIgine (LAMICTAL) 100 MG tablet Take 100 mg by mouth 2 (two) times daily.   metoprolol tartrate (LOPRESSOR) 100 MG tablet Take 1 tablet (100 mg total) by mouth once for 1 dose. 2 hours prior to your CT   terazosin (HYTRIN) 5 MG capsule 5 mg 2 (two) times daily.     Allergies:   Other and Oxycodone   Social History   Socioeconomic History   Marital status: Married    Spouse name: Not on file   Number of children: Not on file   Years of education: Not on file   Highest education level: Not on file  Occupational History   Not on file  Tobacco Use   Smoking status:  Former    Types: Cigarettes    Passive exposure: Past   Smokeless tobacco: Never  Vaping Use   Vaping Use: Never used  Substance and Sexual Activity   Alcohol use: Yes    Comment: couple times weekly   Drug use: Never   Sexual activity: Not on file  Other Topics Concern   Not on file  Social History Narrative   Not on file   Social Determinants of Health   Financial Resource Strain: Not on file  Food Insecurity: Not on file  Transportation Needs: Not on file  Physical Activity: Not on file  Stress: Not on file  Social Connections: Not on file     Family History: The patient's family history includes Breast cancer in his maternal grandmother; Congestive Heart Failure in his father and mother; Diabetes in his brother, father, mother, paternal grandfather, paternal  grandmother, sister, and sister; Healthy in his brother, brother, brother, brother, sister, sister, sister, sister, sister, and sister; Heart attack (age of onset: 61) in his father; High Cholesterol in his brother and father; Hypertension in his mother and paternal grandfather.  ROS:   Review of Systems  Constitution: Negative for decreased appetite, fever and weight gain.  HENT: Negative for congestion, ear discharge, hoarse voice and sore throat.   Eyes: Negative for discharge, redness, vision loss in right eye and visual halos.  Cardiovascular: Negative for chest pain, dyspnea on exertion, leg swelling, orthopnea and palpitations.  Respiratory: Negative for cough, hemoptysis, shortness of breath and snoring.   Endocrine: Negative for heat intolerance and polyphagia.  Hematologic/Lymphatic: Negative for bleeding problem. Does not bruise/bleed easily.  Skin: Negative for flushing, nail changes, rash and suspicious lesions.  Musculoskeletal: Negative for arthritis, joint pain, muscle cramps, myalgias, neck pain and stiffness.  Gastrointestinal: Negative for abdominal pain, bowel incontinence, diarrhea and excessive appetite.  Genitourinary: Negative for decreased libido, genital sores and incomplete emptying.  Neurological: Negative for brief paralysis, focal weakness, headaches and loss of balance.  Psychiatric/Behavioral: Negative for altered mental status, depression and suicidal ideas.  Allergic/Immunologic: Negative for HIV exposure and persistent infections.    EKGs/Labs/Other Studies Reviewed:    The following studies were reviewed today:   EKG:  None today  Echo 03/21/2019    1. Left ventricular ejection fraction, by estimation, is 55 to 60%. The  left ventricle has normal function. The left ventricle has no regional  wall motion abnormalities. The left ventricular internal cavity size was  moderately dilated. Left ventricular  diastolic parameters were normal.   2. Right  ventricular systolic function is normal. The right ventricular  size is normal.   3. The mitral valve is normal in structure. No evidence of mitral valve  regurgitation. No evidence of mitral stenosis.   4. The aortic valve is tricuspid. Aortic valve regurgitation is not  visualized. No aortic stenosis is present.   5. The inferior vena cava is normal in size with greater than 50%  respiratory variability, suggesting right atrial pressure of 3 mmHg.   FINDINGS   Left Ventricle: Left ventricular ejection fraction, by estimation, is 55  to 60%. The left ventricle has normal function. The left ventricle has no  regional wall motion abnormalities. The left ventricular internal cavity  size was moderately dilated.  There is no left ventricular hypertrophy. Left ventricular diastolic  parameters were normal. Normal left ventricular filling pressure.   Right Ventricle: The right ventricular size is normal. No increase in  right ventricular wall thickness. Right ventricular  systolic function is  normal.   Left Atrium: Left atrial size was normal in size.   Right Atrium: Right atrial size was normal in size.   Pericardium: There is no evidence of pericardial effusion.   Mitral Valve: The mitral valve is normal in structure. No evidence of  mitral valve regurgitation. No evidence of mitral valve stenosis.   Tricuspid Valve: The tricuspid valve is normal in structure. Tricuspid  valve regurgitation is trivial. No evidence of tricuspid stenosis.   Aortic Valve: The aortic valve is tricuspid. Aortic valve regurgitation is  not visualized. No aortic stenosis is present. Aortic valve mean gradient  measures 4.0 mmHg. Aortic valve peak gradient measures 6.6 mmHg. Aortic  valve area, by VTI measures 2.95  cm.   Pulmonic Valve: The pulmonic valve was normal in structure. Pulmonic valve  regurgitation is not visualized. No evidence of pulmonic stenosis.   Aorta: The aortic root, ascending  aorta, aortic arch and descending aorta  are all structurally normal, with no evidence of dilitation or  obstruction.   Venous: The inferior vena cava is normal in size with greater than 50%  respiratory variability, suggesting right atrial pressure of 3 mmHg.   IAS/Shunts: No atrial level shunt detected by color flow Doppler.      LEFT VENTRICLE   CT cardiac scoring 12/27/2020 CLINICAL DATA:  Cardiovascular Disease Risk stratification  EXAM: Coronary Calcium Score  TECHNIQUE: A gated, non-contrast computed tomography scan of the heart was performed using 33mm slice thickness. Axial images were analyzed on a dedicated workstation. Calcium scoring of the coronary arteries was performed using the Agatston method.   FINDINGS: Coronary Calcium Score:   Left main: 0   Left anterior descending artery: 0   Left circumflex artery: 0   Right coronary artery: 0   Total: 0   Percentile: 0   Pericardium: Normal.   Ascending Aorta: Normal caliber.   Non-cardiac: See separate report from [facility].   IMPRESSION: Coronary calcium score of 0. This was 0 percentile for age-, race-, and sex-matched controls.   RECOMMENDATIONS: Coronary artery calcium (CAC) score is a strong predictor of incident coronary heart disease (CHD) and provides predictive information beyond traditional risk factors. CAC scoring is reasonable to use in the decision to withhold, postpone, or initiate statin therapy in intermediate-risk or selected borderline-risk asymptomatic adults (age 57-75 years and LDL-C >=70 to <190 mg/dL) who do not have diabetes or established atherosclerotic cardiovascular disease (ASCVD).* In intermediate-risk (10-year ASCVD risk >=7.5% to <20%) adults or selected borderline-risk (10-year ASCVD risk >=5% to <7.5%) adults in whom a CAC score is measured for the purpose of making a treatment decision the following recommendations have been made:   If CAC=0, it is  reasonable to withhold statin therapy and reassess in 5 to 10 years, as long as higher risk conditions are absent (diabetes mellitus, family history of premature CHD in first degree relatives (males <55 years; females <65 years), cigarette smoking, or LDL >=190 mg/dL).   If CAC is 1 to 99, it is reasonable to initiate statin therapy for patients >=59 years of age.   If CAC is >=100 or >=75th percentile, it is reasonable to initiate statin therapy at any age.   Cardiology referral should be considered for patients with CAC scores >=400 or >=75th percentile.   *2018 AHA/ACC/AACVPR/AAPA/ABC/ACPM/ADA/AGS/APhA/ASPC/NLA/PCNA Guideline on the Management of Blood Cholesterol: A Report of the American College of Cardiology/American Heart Association Task Force on Clinical Practice Guidelines. J Am Coll Cardiol. 2019;73(24):3168-3209.  Olga Millers, MD Electronically Signed   Zio monitor 01/2020 Cardiology Laboratory  [facility] Name: Leocadio Heal    Age: 41 Y  MRN: [id number]   DOB: Jan 17, 1976  Beginning Study Date: 01/02/20-01/13/20                              Cardiac Monitor Report    Reason for Study:  Second degree AVB  Type of cardiac monitor:  Zio Patch  Cardiac monitor was worn for 10 days and 18 hours.     Interpretation Summary   1. Recording quality: Fair  2. Sinus Rhythm was observed.  Average HR 88, Minimum 57, Maximum  171.  3. There were no pauses.         Second degree AVB mobitz type I was present.  Wenkebach was  detected within +/- 45 seconds of symptomatic patient event.       AV Block (2nd Mobitz II, 3rd) was not present.  4. Supraventricular Ectopic (SVE) beat burden was: <1.0%      There were no episodes of SVT.  5. Atrial fibrillation and/or flutter was not present.  6. PVC burden was: <1.0%       There were no episodes of wide complex tachycardia.  7. There were 5 patient triggered events which show Sinus and  SVEs.      There were 7  patient symptom episodes which correlate with  sinus, wenckebach, SVEs and VEs.   IMPRESSION:  Results demonstrate predominantly sinus rhythm with episodes of  symptomatic second degree AVB mobitz type I (wenkebach).      I have personally reviewed the results of this test and agree  with the report above.   Jana Half, MD  Assistant Professor (Clinical), Cardiac Electrophysiology  01/26/2020 Specimen Collected: 01/26/20 14:34    Recent Labs: 11/17/2021: ALT 87; BUN 18; Creatinine, Ser 1.80; Hemoglobin 13.6; Platelets 461; Potassium 3.6; Sodium 136  Recent Lipid Panel No results found for: "CHOL", "TRIG", "HDL", "CHOLHDL", "VLDL", "LDLCALC", "LDLDIRECT"  Physical Exam:    VS:  BP (!) 144/66   Pulse 97   Ht 6\' 2"  (1.88 m)   Wt 275 lb 6.4 oz (124.9 kg)   SpO2 97%   BMI 35.36 kg/m     Wt Readings from Last 3 Encounters:  04/05/22 275 lb 6.4 oz (124.9 kg)  11/17/21 270 lb (122.5 kg)  03/09/21 283 lb (128.4 kg)     GEN: Well nourished, well developed in no acute distress HEENT: Normal NECK: No JVD; No carotid bruits LYMPHATICS: No lymphadenopathy CARDIAC: S1S2 noted,RRR, no murmurs, rubs, gallops RESPIRATORY:  Clear to auscultation without rales, wheezing or rhonchi  ABDOMEN: Soft, non-tender, non-distended, +bowel sounds, no guarding. EXTREMITIES: No edema, No cyanosis, no clubbing MUSCULOSKELETAL:  No deformity  SKIN: Warm and dry NEUROLOGIC:  Alert and oriented x 3, non-focal PSYCHIATRIC:  Normal affect, good insight  ASSESSMENT:    1. Encounter for preprocedural laboratory examination   2. Chest pain, unspecified type   3. Benign essential hypertension   4. Obesity (BMI 30-39.9)     PLAN:    The symptoms chest pain is concerning, this patient does have intermediate risk for coronary artery disease and at this time I would like to pursue an ischemic evaluation in this patient.  Shared decision a coronary CTA at this time is appropriate.  I have  discussed with the patient about the testing.  The patient has  no IV contrast allergy and is agreeable to proceed with this test.  Also have asked the patient to get mobile Kardia to monitor his heart rhythm if he feels any further symptoms like this.  We may need to eventually put him on another monitor.  For right now I think if we can capture some recordings from the Kardia mobile that will be much better  Blood pressure is elevated he has otherwise been controlled on his hydrochlorothiazide.  I have asked the patient to take his blood pressure daily and send me that information in 2 weeks should the average of his blood pressure be elevated I plan to adjust his hydrochlorothiazide.   The patient is in agreement with the above plan. The patient left the office in stable condition.  The patient will follow up in 6 months.   Medication Adjustments/Labs and Tests Ordered: Current medicines are reviewed at length with the patient today.  Concerns regarding medicines are outlined above.  Orders Placed This Encounter  Procedures   CT CORONARY MORPH W/CTA COR W/SCORE W/CA W/CM &/OR WO/CM   Basic Metabolic Panel (BMET)    Meds ordered this encounter  Medications   metoprolol tartrate (LOPRESSOR) 100 MG tablet    Sig: Take 1 tablet (100 mg total) by mouth once for 1 dose. 2 hours prior to your CT    Dispense:  1 tablet    Refill:  0     Patient Instructions  Medication Instructions:  Your physician recommends that you continue on your current medications as directed. Please refer to the Current Medication list given to you today.  *If you need a refill on your cardiac medications before your next appointment, please call your pharmacy*   Lab Work: Your physician recommends that you have the following lab drawn today: BMET  If you have labs (blood work) drawn today and your tests are completely normal, you will receive your results only by: MyChart Message (if you have MyChart) OR A  paper copy in the mail If you have any lab test that is abnormal or we need to change your treatment, we will call you to review the results.   Testing/Procedures: Cardiac CT Angiography (CTA), is a special type of CT scan that uses a computer to produce multi-dimensional views of major blood vessels throughout the body. In CT angiography, a contrast material is injected through an IV to help visualize the blood vessels    Follow-Up: At [facility], you and your health needs are our priority.  As part of our continuing mission to provide you with exceptional heart care, we have created designated Provider Care Teams.  These Care Teams include your primary Cardiologist (physician) and Advanced Practice Providers (APPs -  Physician Assistants and Nurse Practitioners) who all work together to provide you with the care you need, when you need it.  We recommend signing up for the patient portal called "MyChart".  Sign up information is provided on this After Visit Summary.  MyChart is used to connect with patients for Virtual Visits (Telemedicine).  Patients are able to view lab/test results, encounter notes, upcoming appointments, etc.  Non-urgent messages can be sent to your provider as well.   To learn more about what you can do with MyChart, go to [email or web address].    Your next appointment:   6 month(s)  The format for your next appointment:   In Person  Provider:   Thomasene Ripple, DO     Other Instructions  Your cardiac CT will be scheduled at one of the below locations:   [facility] [address] [place], [place] [address] [phone]   If scheduled at [employer], please arrive at the [facility] and Children's Entrance (Entrance C2) of [facility] 30 minutes prior to test start time. You can use the FREE valet parking offered at entrance C (encouraged to control the heart rate for the test)  Proceed to the [facility] Radiology  Department (first floor) to check-in and test prep.  All radiology patients and guests should use entrance C2 at [facility], accessed from [facility], even though the hospital's physical address listed is [address].     Please follow these instructions carefully (unless otherwise directed):  Hold all erectile dysfunction medications at least 3 days (72 hrs) prior to test. (Ie viagra, cialis, sildenafil, tadalafil, etc) We will administer nitroglycerin during this exam.   On the Night Before the Test: Be sure to Drink plenty of water. Do not consume any caffeinated/decaffeinated beverages or chocolate 12 hours prior to your test. Do not take any antihistamines 12 hours prior to your test.  On the Day of the Test: Drink plenty of water until 1 hour prior to the test. Do not eat any food 1 hour prior to test. You may take your regular medications prior to the test.  Take metoprolol (Lopressor)  two hours prior to test. HOLD Hydrochlorothiazide morning of the test.       After the Test: Drink plenty of water. After receiving IV contrast, you may experience a mild flushed feeling. This is normal. On occasion, you may experience a mild rash up to 24 hours after the test. This is not dangerous. If this occurs, you can take Benadryl 25 mg and increase your fluid intake. If you experience trouble breathing, this can be serious. If it is severe call 911 IMMEDIATELY. If it is mild, please call our office. If you take any of these medications: Glipizide/Metformin, Avandament, Glucavance, please do not take 48 hours after completing test unless otherwise instructed.  We will call to schedule your test 2-4 weeks out understanding that some insurance companies will need an authorization prior to the service being performed.   For non-scheduling related questions, please contact the cardiac imaging nurse navigator should you have any questions/concerns: Rockwell Alexandria, Cardiac Imaging Nurse Navigator Larey Brick, Cardiac Imaging Nurse Navigator [facility] Direct Office Dial: [phone]   For scheduling needs, including cancellations and rescheduling, please call [place], [phone].   KardiaMobile [email or web address]        FDA-cleared, clinical grade mobile EKG monitor: Lourena Simmonds is the most clinically-validated mobile EKG used by the world's leading cardiac care medical professionals With Basic service, know instantly if your heart rhythm is normal or if atrial fibrillation is detected, and email the last single EKG recording to yourself or your doctor Premium service, available for purchase through the Kardia app for $9.99 per month or $99 per year, includes unlimited history and storage of your EKG recordings, a monthly EKG summary report to share with your doctor, along with the ability to track your blood pressure, activity and weight Includes one KardiaMobile phone clip FREE SHIPPING: Standard delivery 1-3 business days. Orders placed by 11:00am PST will ship that afternoon. Otherwise, will ship next business day. All orders ship via [employer] from [place], [place]    [employer] - sending an EKG [employer]  and set up profile. Run EKG - by placing 1-2 fingers on the silver plates After EKG is complete - Download PDF  - Skip password (if you apply a password the provider will need it to view the EKG) Click share button (square with upward arrow) in bottom left corner To send: choose MyChart (first time log into MyChart)  Pop up window about sending ECG Click continue Choose type of message Choose provider Type subject and message Click send (EKG should be attached)  - To send additional EKGs in one message click the paperclip image and bottom of page to attach.     Important Information About Sugar         Adopting a Healthy Lifestyle.  Know what a healthy  weight is for you (roughly BMI <25) and aim to maintain this   Aim for 7+ servings of fruits and vegetables daily   65-80+ fluid ounces of water or unsweet tea for healthy kidneys   Limit to max 1 drink of alcohol per day; avoid smoking/tobacco   Limit animal fats in diet for cholesterol and heart health - choose grass fed whenever available   Avoid highly processed foods, and foods high in saturated/trans fats   Aim for low stress - take time to unwind and care for your mental health   Aim for 150 min of moderate intensity exercise weekly for heart health, and weights twice weekly for bone health   Aim for 7-9 hours of sleep daily   When it comes to diets, agreement about the perfect plan isnt easy to find, even among the experts. Experts at the [facility] [employer] developed an idea known as the Healthy Eating Plate. Just imagine a plate divided into logical, healthy portions.   The emphasis is on diet quality:   Load up on vegetables and fruits - one-half of your plate: Aim for color and variety, and remember that potatoes dont count.   Go for whole grains - one-quarter of your plate: Whole wheat, barley, wheat berries, quinoa, oats, brown rice, and foods made with them. If you want pasta, go with whole wheat pasta.   Protein power - one-quarter of your plate: Fish, chicken, beans, and nuts are all healthy, versatile protein sources. Limit red meat.   The diet, however, does go beyond the plate, offering a few other suggestions.   Use healthy plant oils, such as olive, canola, soy, corn, sunflower and peanut. Check the labels, and avoid partially hydrogenated oil, which have unhealthy trans fats.   If youre thirsty, drink water. Coffee and tea are good in moderation, but skip sugary drinks and limit milk and dairy products to one or two daily servings.   The type of carbohydrate in the diet is more important than the amount. Some sources of carbohydrates, such as  vegetables, fruits, whole grains, and beans-are healthier than others.   Finally, stay active  Signed, Thomasene Ripple, DO  04/05/2022 8:59 AM     [facility]

## 2022-04-16 ENCOUNTER — Telehealth (HOSPITAL_COMMUNITY): Payer: Self-pay | Admitting: Emergency Medicine

## 2022-04-16 NOTE — Telephone Encounter (Signed)
Reaching out to patient to offer assistance regarding upcoming cardiac imaging study; pt verbalizes understanding of appt date/time, parking situation and where to check in, pre-test NPO status and medications ordered, and verified current allergies; name and call back number provided for further questions should they arise Rockwell Alexandria RN Navigator Cardiac Imaging [facility] [phone] office [phone] cell  Arrival 1230 100mg  metoprolol  Denies iv issues Aware nitro/contrast

## 2022-04-17 ENCOUNTER — Ambulatory Visit (HOSPITAL_COMMUNITY)
Admission: RE | Admit: 2022-04-17 | Discharge: 2022-04-17 | Disposition: A | Discharge status: Home / Self Care | Payer: 59 | Source: Ambulatory Visit | Attending: Cardiology | Admitting: Cardiology

## 2022-04-17 DIAGNOSIS — R079 Chest pain, unspecified: Secondary | ICD-10-CM | POA: Diagnosis not present

## 2022-04-17 MED ORDER — METOPROLOL TARTRATE 5 MG/5ML IV SOLN
10.0000 mg | INTRAVENOUS | Status: AC | PRN
  Administered 2022-04-17: 10 mg via INTRAVENOUS

## 2022-04-17 MED ORDER — IOHEXOL 350 MG/ML SOLN
100.0000 mL | Freq: Once | INTRAVENOUS | Status: AC | PRN
  Administered 2022-04-17: 100 mL via INTRAVENOUS

## 2022-04-17 MED ORDER — NITROGLYCERIN 0.4 MG SL SUBL
SUBLINGUAL_TABLET | SUBLINGUAL | Status: AC
  Filled 2022-04-17: qty 2

## 2022-04-17 MED ORDER — NITROGLYCERIN 0.4 MG SL SUBL
0.8000 mg | SUBLINGUAL_TABLET | Freq: Once | SUBLINGUAL | Status: AC
  Administered 2022-04-17: 0.8 mg via SUBLINGUAL

## 2022-04-17 MED ORDER — DILTIAZEM HCL 25 MG/5ML IV SOLN
INTRAVENOUS | Status: AC
  Filled 2022-04-17: qty 5

## 2022-04-17 MED ORDER — METOPROLOL TARTRATE 5 MG/5ML IV SOLN
INTRAVENOUS | Status: AC
  Administered 2022-04-17: 10 mg via INTRAVENOUS
  Filled 2022-04-17: qty 20

## 2022-05-02 ENCOUNTER — Other Ambulatory Visit: Payer: Self-pay | Admitting: Neurology

## 2022-05-02 DIAGNOSIS — G35 Multiple sclerosis: Secondary | ICD-10-CM

## 2022-05-24 ENCOUNTER — Encounter (HOSPITAL_COMMUNITY): Admission: RE | Payer: Self-pay | Source: Home / Self Care

## 2022-05-24 ENCOUNTER — Ambulatory Visit (HOSPITAL_COMMUNITY): Admission: RE | Admit: 2022-05-24 | Payer: 59 | Source: Home / Self Care | Admitting: Gastroenterology

## 2022-05-24 SURGERY — IMAGING PROCEDURE, GI TRACT, INTRALUMINAL, VIA CAPSULE
Anesthesia: LOCAL

## 2022-06-19 IMAGING — MR MR THORACIC SPINE WO/W CM
6 of 9 series · 27 of 48 positions shown · IV contrast (multihance)
Comparison: MRI examination dated May 20, 2020

CLINICAL DATA: Multiple sclerosis follow-up.

EXAM:
MRI THORACIC WITHOUT AND WITH CONTRAST
TECHNIQUE: Multiplanar and multiecho pulse sequences of the thoracic spine were
obtained without and with intravenous contrast.
CONTRAST:  20mL MULTIHANCE GADOBENATE DIMEGLUMINE 529 MG/ML IV SOLN

[Series 17: T1 · sagittal · 3.0mm · 1.04mm/px · 4 of 21 slices shown (1 of 2)]
[im 1/21]
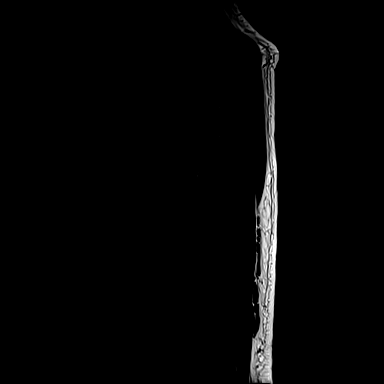
[im 7/21]
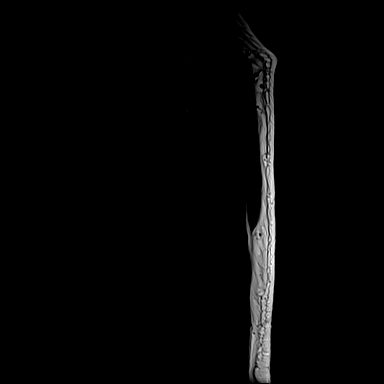
[im 14/21]
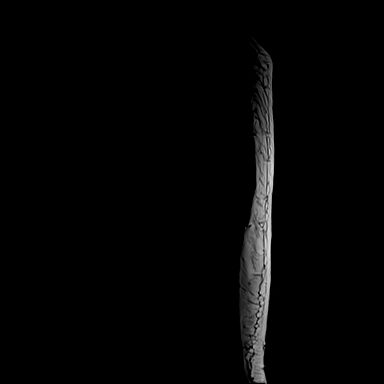
[im 21/21]
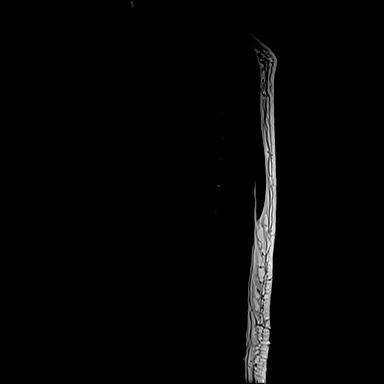

[Series 18: STIR · sagittal · 3.0mm · 1.25mm/px · 1 of 21 slices shown]
[im 1/21]
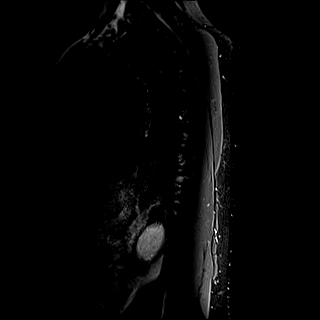

[Series 19: T2 · axial · 4.0mm · 0.28mm/px · z∈[-360,-89]mm · 7 of 39 slices shown (1 of 2)]
[im 1/39]
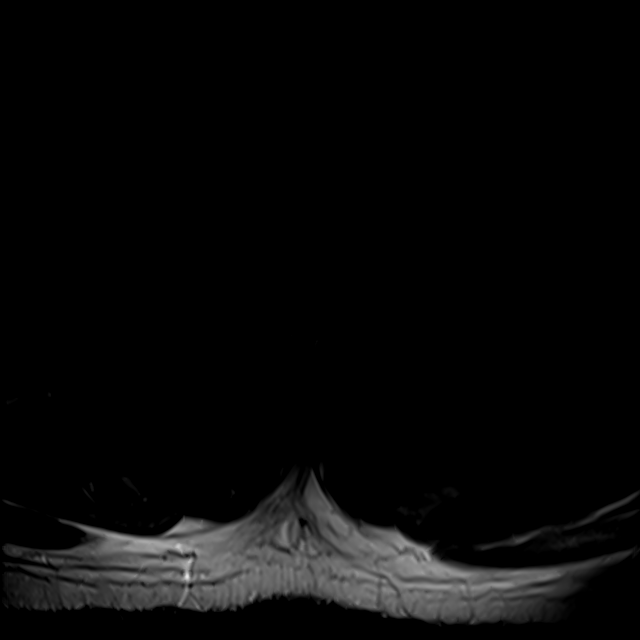
[im 7/39]
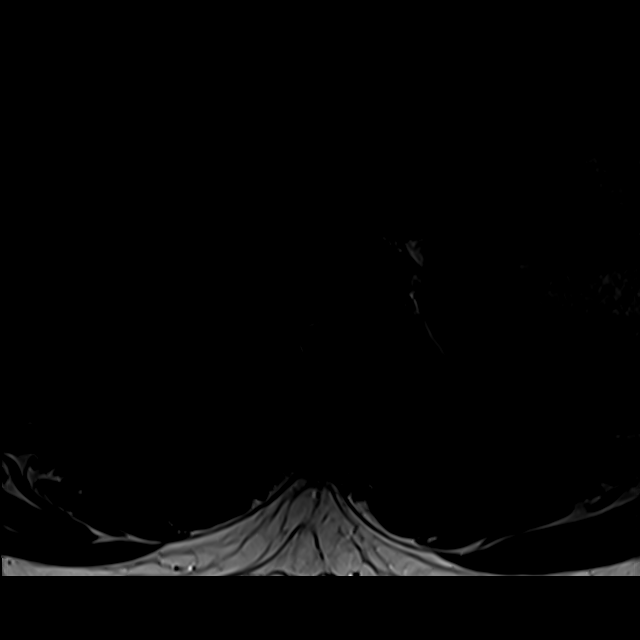
[im 13/39]
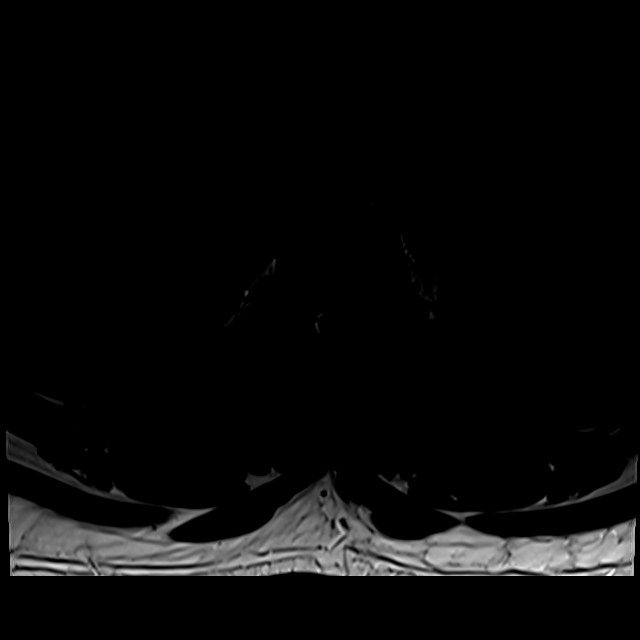
[im 20/39]
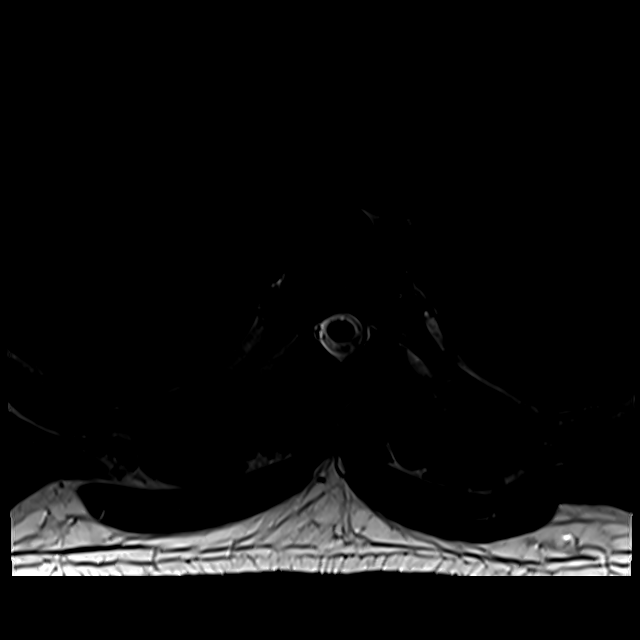
[im 26/39]
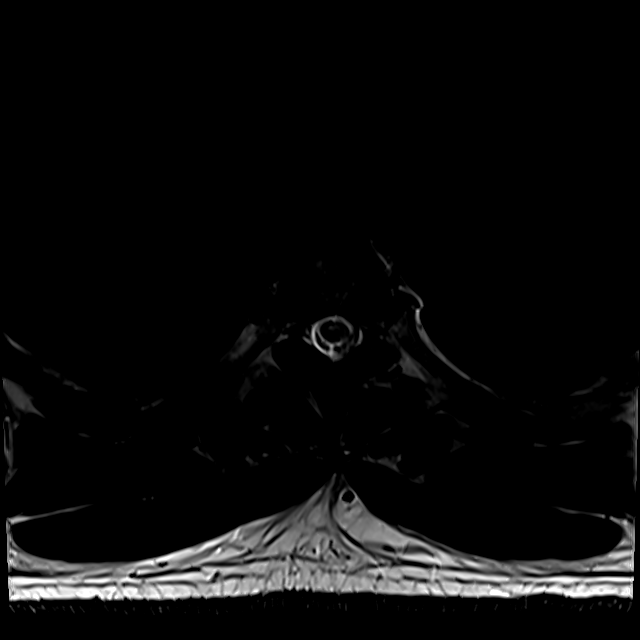
[im 32/39]
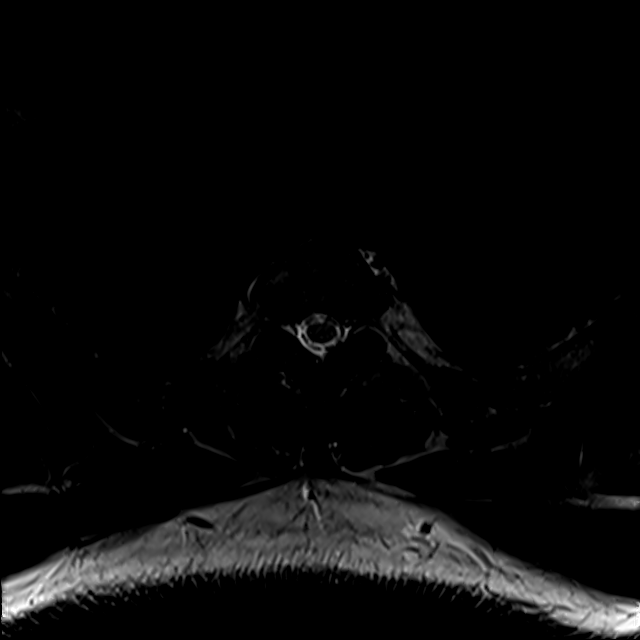
[im 39/39]
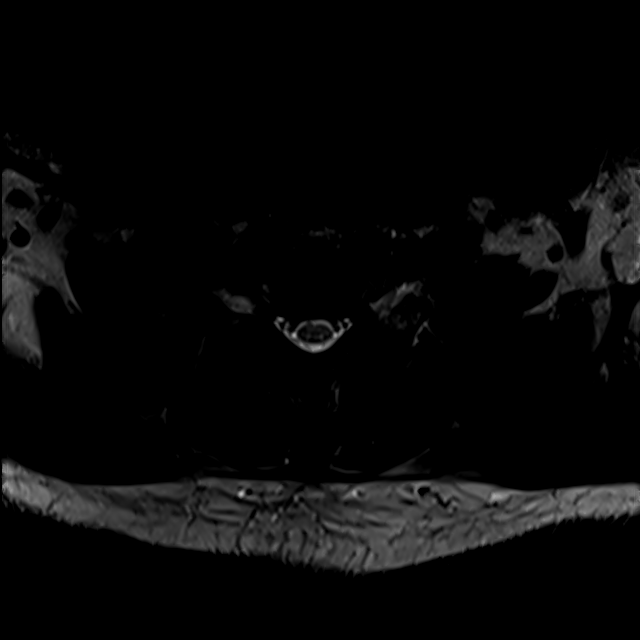

[Series 21: T1 · axial · non-contrast · 4.0mm · 0.56mm/px · z∈[-360,-89]mm · 7 of 39 slices shown (2 of 2)]
[im 1/39]
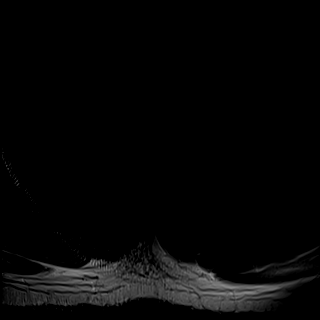
[im 7/39]
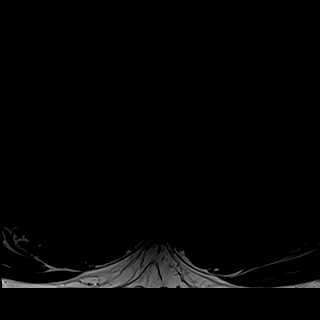
[im 13/39]
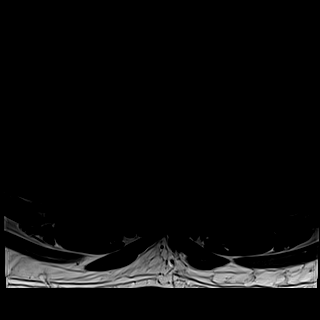
[im 20/39]
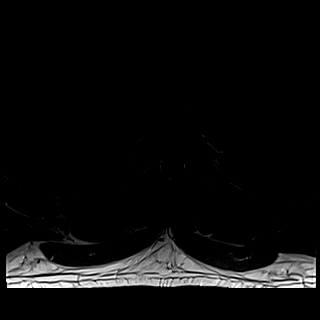
[im 26/39]
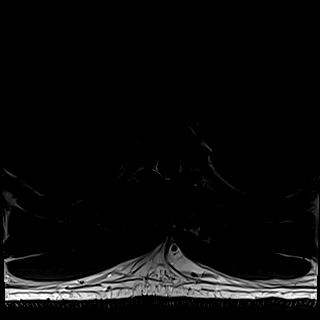
[im 32/39]
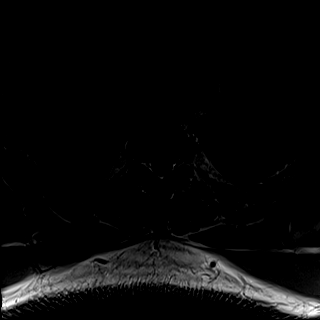
[im 39/39]
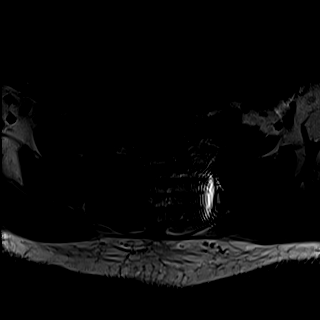

[Series 22: T2 · sagittal · 3.0mm · 0.83mm/px · 4 of 21 slices shown (2 of 2)]
[im 1/21]
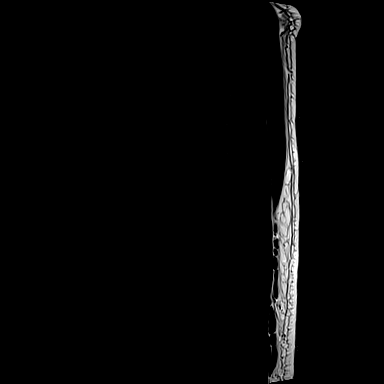
[im 7/21]
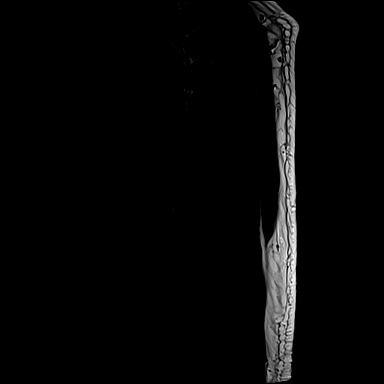
[im 14/21]
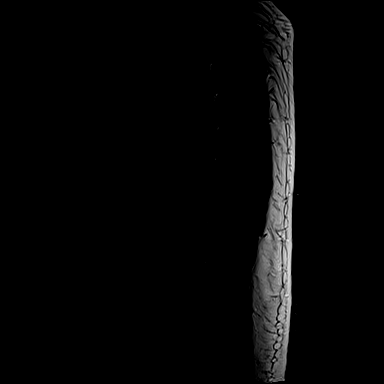
[im 21/21]
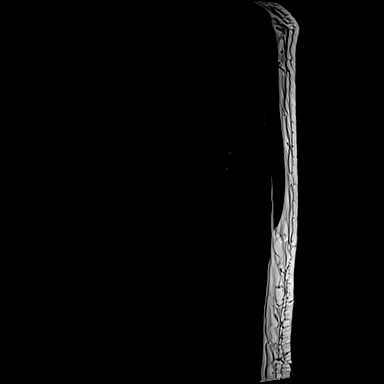

[Series 23: T1 fat-sat · sagittal · 3.0mm · 1.00mm/px · 4 of 21 slices shown]
[im 1/21]
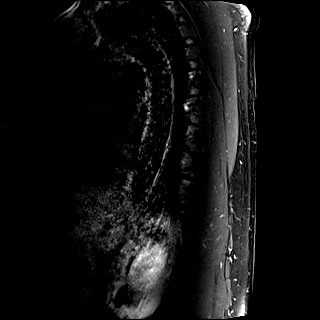
[im 7/21]
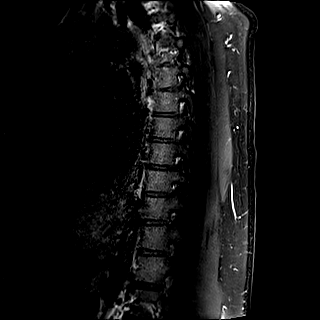
[im 14/21]
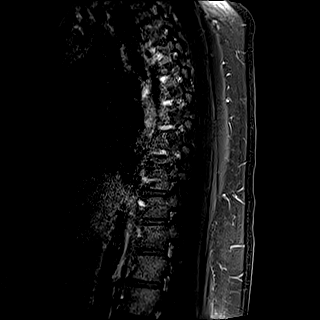
[im 21/21]
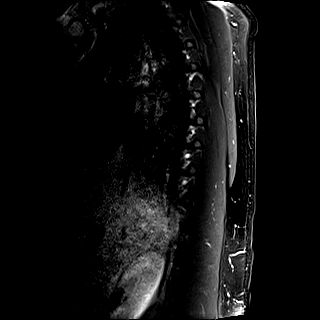

[27 of 48 positions shown; findings below may reference images not displayed]

FINDINGS: Alignment:  Physiologic.

Vertebrae: No fracture, evidence of discitis, or bone lesion.

Cord:  Normal signal and morphology.

Paraspinal and other soft tissues: Negative.

Disc levels:

No significant disc herniation, spinal canal or neural foraminal
stenosis.
IMPRESSION: Normal examination.

## 2022-07-16 ENCOUNTER — Encounter (HOSPITAL_COMMUNITY): Payer: Self-pay | Admitting: Emergency Medicine

## 2022-07-16 ENCOUNTER — Emergency Department (HOSPITAL_COMMUNITY)
Admission: EM | Admit: 2022-07-16 | Discharge: 2022-07-16 | Disposition: A | Discharge status: Home / Self Care | Payer: 59 | Attending: Emergency Medicine | Admitting: Emergency Medicine

## 2022-07-16 ENCOUNTER — Other Ambulatory Visit: Payer: Self-pay

## 2022-07-16 ENCOUNTER — Emergency Department (HOSPITAL_COMMUNITY): Payer: 59

## 2022-07-16 DIAGNOSIS — R6884 Jaw pain: Secondary | ICD-10-CM | POA: Diagnosis present

## 2022-07-16 DIAGNOSIS — S0300XA Dislocation of jaw, unspecified side, initial encounter: Secondary | ICD-10-CM

## 2022-07-16 DIAGNOSIS — S0301XA Dislocation of jaw, right side, initial encounter: Secondary | ICD-10-CM | POA: Diagnosis not present

## 2022-07-16 MED ORDER — HYDROCODONE-ACETAMINOPHEN 5-325 MG PO TABS
1.0000 | ORAL_TABLET | Freq: Once | ORAL | Status: AC
  Administered 2022-07-16: 1 via ORAL
  Filled 2022-07-16: qty 1

## 2022-07-16 MED ORDER — ONDANSETRON HCL 4 MG/2ML IJ SOLN
4.0000 mg | Freq: Once | INTRAMUSCULAR | Status: AC
  Administered 2022-07-16: 4 mg via INTRAVENOUS
  Filled 2022-07-16: qty 2

## 2022-07-16 MED ORDER — ETOMIDATE 2 MG/ML IV SOLN
0.1000 mg/kg | Freq: Once | INTRAVENOUS | Status: DC
  Filled 2022-07-16: qty 10

## 2022-07-16 MED ORDER — LACTATED RINGERS IV BOLUS
1000.0000 mL | Freq: Once | INTRAVENOUS | Status: AC
  Administered 2022-07-16: 1000 mL via INTRAVENOUS

## 2022-07-16 MED ORDER — ONDANSETRON 4 MG PO TBDP
8.0000 mg | ORAL_TABLET | Freq: Once | ORAL | Status: AC
  Administered 2022-07-16: 8 mg via ORAL
  Filled 2022-07-16: qty 2

## 2022-07-16 MED ORDER — KETOROLAC TROMETHAMINE 15 MG/ML IJ SOLN
15.0000 mg | Freq: Once | INTRAMUSCULAR | Status: AC
  Administered 2022-07-16: 15 mg via INTRAVENOUS
  Filled 2022-07-16: qty 1

## 2022-07-16 MED ORDER — ETOMIDATE 2 MG/ML IV SOLN
INTRAVENOUS | Status: AC | PRN
  Administered 2022-07-16: 10 mg via INTRAVENOUS

## 2022-07-16 NOTE — Progress Notes (Signed)
RT at bedside for a conscious sedation procedure. Pt on etCO2 [place], suction setup and available, ambu bag at bedside, airway cart at door. Pt tolerated well.

## 2022-07-16 NOTE — Discharge Instructions (Addendum)
Your job was moved back into place.  He will need to be on soft food diet for the next 2 weeks, do not open your jaw wider than 2 cm, support your mouth when you have to yawn.  For any concerning symptoms return to the emergency department.

## 2022-07-16 NOTE — ED Notes (Signed)
Pt is currently talking on the phone and eating lunch.  No new orders at this time.
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
## 2022-07-17 NOTE — ED Provider Notes (Signed)
.  Sedation  Date/Time: 07/16/2022 3:30 PM  Performed by: Godfrey Pick, MD Authorized by: Godfrey Pick, MD   Consent:    Consent obtained:  Verbal and written   Consent given by:  Patient   Risks discussed:  Allergic reaction, prolonged hypoxia resulting in organ damage, nausea, vomiting and respiratory compromise necessitating ventilatory assistance and intubation   Alternatives discussed:  Analgesia without sedation Universal protocol:    Immediately prior to procedure, a time out was called: yes     Patient identity confirmed:  Verbally with patient Indications:    Procedure performed:  Dislocation reduction   Procedure necessitating sedation performed by:  Different physician Pre-sedation assessment:    Time since last food or drink:  6 hours   ASA classification: class 2 - patient with mild systemic disease     Mouth opening:  2 finger widths   Thyromental distance:  3 finger widths   Mallampati score:  II - soft palate, uvula, fauces visible   Pre-sedation assessments completed and reviewed: airway patency, cardiovascular function, hydration status, mental status, nausea/vomiting, pain level and respiratory function     Pre-sedation assessment completed:  07/16/2022 3:00 PM Immediate pre-procedure details:    Reassessment: Patient reassessed immediately prior to procedure     Reviewed: vital signs, relevant labs/tests and NPO status     Verified: bag valve mask available, emergency equipment available, intubation equipment available, IV patency confirmed and oxygen available   Procedure details (see MAR for exact dosages):    Preoxygenation:  Room air   Sedation:  Etomidate   Intended level of sedation: moderate (conscious sedation)   Intra-procedure monitoring:  Blood pressure monitoring, cardiac monitor, continuous capnometry, continuous pulse oximetry, frequent LOC assessments and frequent vital sign checks   Intra-procedure events: none     Total Provider sedation time  (minutes):  10 Post-procedure details:    Post-sedation assessment completed:  07/16/2022 3:15 PM   Attendance: Constant attendance by certified staff until patient recovered     Recovery: Patient returned to pre-procedure baseline     Post-sedation assessments completed and reviewed: airway patency, cardiovascular function, hydration status, mental status, nausea/vomiting, pain level and respiratory function     Patient is stable for discharge or admission: yes     Procedure completion:  Tolerated well, no immediate complications     Godfrey Pick, MD 07/17/22 [phone]

## 2022-09-06 ENCOUNTER — Encounter: Payer: Self-pay | Admitting: Neurology

## 2022-09-11 ENCOUNTER — Other Ambulatory Visit: Payer: Self-pay | Admitting: *Deleted

## 2022-09-11 DIAGNOSIS — I8393 Asymptomatic varicose veins of bilateral lower extremities: Secondary | ICD-10-CM

## 2022-09-20 ENCOUNTER — Ambulatory Visit (HOSPITAL_COMMUNITY)
Admission: RE | Admit: 2022-09-20 | Discharge: 2022-09-20 | Disposition: A | Discharge status: Home / Self Care | Payer: BC Managed Care – PPO | Source: Ambulatory Visit | Attending: Vascular Surgery | Admitting: Vascular Surgery

## 2022-09-20 DIAGNOSIS — I8393 Asymptomatic varicose veins of bilateral lower extremities: Secondary | ICD-10-CM | POA: Diagnosis not present

## 2022-09-21 ENCOUNTER — Ambulatory Visit (INDEPENDENT_AMBULATORY_CARE_PROVIDER_SITE_OTHER): Payer: BC Managed Care – PPO | Admitting: Physician Assistant

## 2022-09-21 ENCOUNTER — Encounter: Payer: Self-pay | Admitting: Physician Assistant

## 2022-09-21 VITALS — BP 131/70 | HR 92 | Temp 98.1°F | Resp 16 | Ht 74.0 in | Wt 277.0 lb

## 2022-09-21 DIAGNOSIS — I8393 Asymptomatic varicose veins of bilateral lower extremities: Secondary | ICD-10-CM

## 2022-09-21 NOTE — Progress Notes (Unsigned)
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 male who presents with chief complaint: swollen leg.  Patient notes, onset of swelling several years ago, associated with prolonged sitting and standing.  The patient has had no history of DVT, positive history of varicose vein, no history of venous stasis ulcers, no history of  Lymphedema and no history of skin changes in lower legs.  There is unknown family history of venous disorders.  The patient has  used ankle compression stockings in the past.   He moved to [place] from [place] for his wife's job.  He works out of town periodically and flys to [employer].  He states he has been diagnosed with MS and tries to take of his body.  He lifts weights and stays active.  He has history of sclero therapy x 7 treatments B LE and the surface veins continue to come back.  He does not have history of reflux on previous studies.  He denies claudication or rest pain with tissue loss.     Past Medical History:  Diagnosis Date   MS (multiple sclerosis) ([facility])     Past Surgical History:  Procedure Laterality Date   KNEE SURGERY Right    x 4, ACL    Social History   Socioeconomic History   Marital status: Married    Spouse name: Not on file   Number of children: Not on file   Years of education: Not on file   Highest education level: Not on file  Occupational History   Not on file  Tobacco Use   Smoking status: Former    Types: Cigarettes    Passive exposure: Past   Smokeless tobacco: Never  Vaping Use   Vaping Use: Never used  Substance and Sexual Activity   Alcohol use: Yes    Comment: couple times weekly   Drug use: Never   Sexual activity: Not on file  Other Topics Concern   Not on file  Social History Narrative   Not on file   Social Determinants of Health   Financial Resource Strain: Not on file  Food Insecurity: Not on file   Transportation Needs: Not on file  Physical Activity: Not on file  Stress: Not on file  Social Connections: Not on file  Intimate Partner Violence: Not on file    Family History  Problem Relation Age of Onset   Congestive Heart Failure Mother    Diabetes Mother    Hypertension Mother    Heart attack Father 11   Congestive Heart Failure Father    Diabetes Father    High Cholesterol Father    Diabetes Sister    Diabetes Sister    Healthy Sister    Healthy Sister    Healthy Sister    Healthy Sister    Healthy Sister    Healthy Sister    Diabetes Brother    High Cholesterol Brother    Healthy Brother    Healthy Brother    Healthy Brother    Healthy Brother    Breast cancer Maternal Grandmother    Diabetes Paternal Grandmother    Diabetes Paternal Grandfather    Hypertension Paternal Grandfather     Current Outpatient Medications on File Prior to Visit  Medication Sig Dispense Refill   amphetamine-dextroamphetamine (ADDERALL) 10 MG tablet Take 10 mg by mouth 2 (two) times daily with a meal.  baclofen (LIORESAL) 10 MG tablet Take 10 mg by mouth 2 (two) times daily as needed for muscle spasms.     busPIRone (BUSPAR) 5 MG tablet Take 5 mg by mouth 2 (two) times daily.     Dimethyl Fumarate 120 MG CPDR Take 120 mg by mouth daily.     ezetimibe (ZETIA) 10 MG tablet Take 10 mg by mouth daily.     gabapentin (NEURONTIN) 600 MG tablet Take 600 mg by mouth 2 (two) times daily.     hydrochlorothiazide (HYDRODIURIL) 12.5 MG tablet Take 12.5 mg by mouth daily.     lamoTRIgine (LAMICTAL) 100 MG tablet Take 100 mg by mouth 2 (two) times daily.     Multiple Vitamins-Minerals (MULTIVITAMIN WITH MINERALS) tablet Take 1 tablet by mouth daily.     tamsulosin (FLOMAX) 0.4 MG CAPS capsule Take 0.4 mg by mouth daily.     terazosin (HYTRIN) 5 MG capsule Take 5 mg by mouth at bedtime.     tretinoin (RETIN-A) 0.05 % cream Apply 1 Application topically at bedtime as needed (acne).     No  current facility-administered medications on file prior to visit.    Allergies as of 09/21/2022 - Review Complete 09/21/2022  Allergen Reaction Noted   Oxycodone Nausea And Vomiting 05/31/2017     ROS:   General:  No weight loss, Fever, chills  HEENT: No recent headaches, no nasal bleeding, no visual changes, no sore throat  Neurologic: No dizziness, blackouts, seizures. No recent symptoms of stroke or mini- stroke. No recent episodes of slurred speech, or temporary blindness.  Cardiac: No recent episodes of chest pain/pressure, no shortness of breath at rest.  No shortness of breath with exertion.  Denies history of atrial fibrillation or irregular heartbeat  Vascular: No history of rest pain in feet.  No history of claudication.  No history of non-healing ulcer, No history of DVT   Pulmonary: No home oxygen, no productive cough, no hemoptysis,  No asthma or wheezing  Musculoskeletal:  [ ]  Arthritis, [ ]  Low back pain,  [ ]  Joint pain  Hematologic:No history of hypercoagulable state.  No history of easy bleeding.  No history of anemia  Gastrointestinal: No hematochezia or melena,  No gastroesophageal reflux, no trouble swallowing  Urinary: [ ]  chronic Kidney disease, [ ]  on HD - [ ]  MWF or [ ]  TTHS, [ ]  Burning with urination, [ ]  Frequent urination, [ ]  Difficulty urinating;   Skin: No rashes  Psychological: No history of anxiety,  No history of depression  Physical Examination  Vitals:   09/21/22 1332  BP: 131/70  Pulse: 92  Resp: 16  Temp: 98.1 F (36.7 C)  TempSrc: Temporal  SpO2: 96%  Weight: 277 lb (125.6 kg)  Height: 6\' 2"  (1.88 m)    Body mass index is 35.56 kg/m.  General:  Alert and oriented, no acute distress HEENT: Normal Neck: No bruit or JVD Pulmonary: Clear to auscultation bilaterally Cardiac: Regular Rate and Rhythm without murmur Abdomen: Soft, non-tender, non-distended, no mass, no scars Skin: No rash         Extremity Pulses:  2+  radial, brachial, femoral, dorsalis pedis, posterior tibial pulses bilaterally Musculoskeletal: No deformity or edema  Neurologic: Upper and lower extremity motor 5/5 and symmetric  DATA:    Venous Reflux Times  +--------------+---------+------+-----------+------------+--------+  RIGHT        Reflux NoRefluxReflux TimeDiameter cmsComments  Yes                                   +--------------+---------+------+-----------+------------+--------+  CFV          no                                              +--------------+---------+------+-----------+------------+--------+  FV mid        no                                              +--------------+---------+------+-----------+------------+--------+  Popliteal    no                                              +--------------+---------+------+-----------+------------+--------+  GSV at SFJ    no                            .500              +--------------+---------+------+-----------+------------+--------+  GSV prox thighno                            .578              +--------------+---------+------+-----------+------------+--------+  GSV mid thigh no                            .610              +--------------+---------+------+-----------+------------+--------+  GSV dist thighno                            .586              +--------------+---------+------+-----------+------------+--------+  GSV at knee   no                            .451              +--------------+---------+------+-----------+------------+--------+  GSV prox calf           yes    >500 ms      .432              +--------------+---------+------+-----------+------------+--------+  GSV mid calf            yes    >500 ms      .347              +--------------+---------+------+-----------+------------+--------+  SSV Pop Fossa no                             .220              +--------------+---------+------+-----------+------------+--------+  SSV prox calf           yes    >500 ms      .224              +--------------+---------+------+-----------+------------+--------+  SSV mid calf            yes    >500 ms      .196              +--------------+---------+------+-----------+------------+--------+     +--------------+---------+------+-----------+------------+--------+  LEFT         Reflux NoRefluxReflux TimeDiameter cmsComments                          Yes                                   +--------------+---------+------+-----------+------------+--------+  CFV          no                                              +--------------+---------+------+-----------+------------+--------+  FV mid        no                                              +--------------+---------+------+-----------+------------+--------+  Popliteal    no                                              +--------------+---------+------+-----------+------------+--------+  GSV at SFJ    no                            .599              +--------------+---------+------+-----------+------------+--------+  GSV prox thighno                            .493              +--------------+---------+------+-----------+------------+--------+  GSV mid thigh no                            .531              +--------------+---------+------+-----------+------------+--------+  GSV dist thighno                            .590              +--------------+---------+------+-----------+------------+--------+  GSV at knee   no                            .635              +--------------+---------+------+-----------+------------+--------+  GSV prox calf no                            .447              +--------------+---------+------+-----------+------------+--------+  GSV mid calf            yes     >500 ms      .404              +--------------+---------+------+-----------+------------+--------+  SSV Pop Fossa no                            .377              +--------------+---------+------+-----------+------------+--------+  SSV prox calf no                            .308              +--------------+---------+------+-----------+------------+--------+       Summary:  Bilateral:  - No evidence of deep vein thrombosis seen in the lower extremities,  bilaterally, from the common femoral through the popliteal veins.  - No evidence of superficial venous thrombosis in the lower extremities,  bilaterally.    Right:  - Venous reflux is noted in the right greater saphenous vein in the calf.  - Venous reflux is noted in the right short saphenous vein.    Left:  - Venous reflux is noted in the left greater saphenous vein in the calf.   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  Right:  - Venous reflux is noted in the right greater saphenous vein in the calf.  - Venous reflux is noted in the right short saphenous vein.    Left:  - Venous reflux is noted in the left greater saphenous vein in the calf.   He does not have SFJ reflux on the studies, nor are his GSV's reported to have reflux, how ever he does have vein sive > 0.4.  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  He simply does not like how his lower extremities look.  I'm no sure of intervention routes he is best suited for so I will schedule him to f/u with one of our vein physicians to discuss his options.     Mosetta Pigeon PA-C [facility] Office: [phone]  MD in clinic [place]

## 2022-09-24 ENCOUNTER — Encounter: Payer: Self-pay | Admitting: Physician Assistant

## 2022-09-28 ENCOUNTER — Encounter (HOSPITAL_COMMUNITY): Payer: 59

## 2022-10-02 ENCOUNTER — Encounter: Payer: Self-pay | Admitting: Neurology

## 2022-10-03 ENCOUNTER — Encounter: Payer: Self-pay | Admitting: Neurology

## 2022-10-03 ENCOUNTER — Ambulatory Visit: Payer: BC Managed Care – PPO | Attending: Cardiology | Admitting: Cardiology

## 2022-10-06 ENCOUNTER — Other Ambulatory Visit: Payer: 59

## 2022-10-09 ENCOUNTER — Ambulatory Visit
Admission: RE | Admit: 2022-10-09 | Discharge: 2022-10-09 | Disposition: A | Discharge status: Home / Self Care | Payer: BC Managed Care – PPO | Source: Ambulatory Visit | Attending: Neurology | Admitting: Neurology

## 2022-10-09 DIAGNOSIS — M79643 Pain in unspecified hand: Secondary | ICD-10-CM | POA: Diagnosis not present

## 2022-10-09 DIAGNOSIS — G35 Multiple sclerosis: Secondary | ICD-10-CM

## 2022-10-09 DIAGNOSIS — M7740 Metatarsalgia, unspecified foot: Secondary | ICD-10-CM | POA: Diagnosis not present

## 2022-10-09 DIAGNOSIS — G35D Multiple sclerosis, unspecified: Secondary | ICD-10-CM

## 2022-10-09 MED ORDER — GADOPICLENOL 0.5 MMOL/ML IV SOLN
10.0000 mL | Freq: Once | INTRAVENOUS | Status: AC | PRN
  Administered 2022-10-09: 10 mL via INTRAVENOUS

## 2022-10-10 ENCOUNTER — Ambulatory Visit (INDEPENDENT_AMBULATORY_CARE_PROVIDER_SITE_OTHER): Payer: BC Managed Care – PPO | Admitting: Vascular Surgery

## 2022-10-10 ENCOUNTER — Encounter: Payer: Self-pay | Admitting: Vascular Surgery

## 2022-10-10 VITALS — BP 122/73 | HR 76 | Temp 98.2°F | Resp 20 | Ht 74.0 in | Wt 268.5 lb

## 2022-10-10 DIAGNOSIS — I8393 Asymptomatic varicose veins of bilateral lower extremities: Secondary | ICD-10-CM

## 2022-10-10 NOTE — Progress Notes (Signed)
Patient ID: Alex Ramirez, male   DOB: July 24, 1975, 47 y.o.   MRN: [id number]  Reason for Consult: No chief complaint on file.   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  He has had at least 7 episodes of sclerotherapy in the past.  He has worn ankle compression socks in the past.  He is very active does not have any leg swelling.  Denies any history of DVT or venous interventions other than sclerotherapy.  Past Medical History:  Diagnosis Date   MS (multiple sclerosis) (HCC)    Family History  Problem Relation Age of Onset   Congestive Heart Failure Mother    Diabetes Mother    Hypertension Mother    Heart attack Father 52   Congestive Heart Failure Father    Diabetes Father    High Cholesterol Father    Diabetes Sister    Diabetes Sister    Healthy Sister    Healthy Sister    Healthy Sister    Healthy Sister    Healthy Sister    Healthy Sister    Diabetes Brother    High Cholesterol Brother    Healthy Brother    Healthy Brother    Healthy Brother    Healthy Brother    Breast cancer Maternal Grandmother    Diabetes Paternal Grandmother    Diabetes Paternal Grandfather    Hypertension Paternal Grandfather    Past Surgical History:  Procedure Laterality Date   KNEE SURGERY Right    x 4, ACL    Short Social History:  Social History   Tobacco Use   Smoking status: Former    Types: Cigarettes    Passive exposure: Past   Smokeless tobacco: Never  Substance Use Topics   Alcohol use: Yes    Comment: couple times weekly    Allergies  Allergen Reactions   Oxycodone Nausea And Vomiting    Current Outpatient Medications  Medication Sig Dispense Refill   amphetamine-dextroamphetamine (ADDERALL) 10 MG tablet Take 10 mg by mouth 2 (two) times daily with a meal.     baclofen (LIORESAL) 10 MG tablet Take 10 mg by mouth 2 (two) times daily as needed for muscle spasms.      busPIRone (BUSPAR) 5 MG tablet Take 5 mg by mouth 2 (two) times daily.     Dimethyl Fumarate 120 MG CPDR Take 120 mg by mouth daily.     ezetimibe (ZETIA) 10 MG tablet Take 10 mg by mouth daily.     gabapentin (NEURONTIN) 600 MG tablet Take 600 mg by mouth 2 (two) times daily.     hydrochlorothiazide (HYDRODIURIL) 12.5 MG tablet Take 12.5 mg by mouth daily.     lamoTRIgine (LAMICTAL) 100 MG tablet Take 100 mg by mouth 2 (two) times daily.     Multiple Vitamins-Minerals (MULTIVITAMIN WITH MINERALS) tablet Take 1 tablet by mouth daily.     tamsulosin (FLOMAX) 0.4 MG CAPS capsule Take 0.4 mg by mouth daily.     terazosin (HYTRIN) 5 MG capsule Take 5 mg by mouth at bedtime.     tretinoin (RETIN-A) 0.05 % cream Apply 1 Application topically at bedtime as needed (acne).     No current facility-administered medications for this visit.    Review of Systems  Constitutional:  Constitutional negative. HENT: HENT negative.  Eyes: Eyes negative.  Respiratory: Respiratory negative.  Cardiovascular: Cardiovascular negative.  GI: Gastrointestinal negative.  Musculoskeletal: Musculoskeletal negative.  Skin:       Varicose veins Neurological: Neurological negative. Hematologic: Hematologic/lymphatic negative.  Psychiatric: Psychiatric negative.        Objective:  Objective  Vitals:   10/10/22 0841  BP: 122/73  Pulse: 76  Resp: 20  Temp: 98.2 F (36.8 C)  SpO2: 97%     Physical Exam HENT:     Head: Normocephalic.     Nose: Nose normal.  Eyes:     Pupils: Pupils are equal, round, and reactive to light.  Cardiovascular:     Rate and Rhythm: Normal rate.     Pulses: Normal pulses.  Pulmonary:     Effort: Pulmonary effort is normal.  Abdominal:     General: Abdomen is flat.  Musculoskeletal:     Cervical back: Normal range of motion and neck supple.     Right lower leg: No edema.     Left lower leg: No edema.     Comments: Areas of reticular veins lateral thighs posterior knees   Skin:    General: Skin is warm.     Capillary Refill: Capillary refill takes less than 2 seconds.  Neurological:     General: No focal deficit present.     Mental Status: He is alert.  Psychiatric:        Mood and Affect: Mood normal.        Thought Content: Thought content normal.        Judgment: Judgment normal.        Data: Venous Reflux Times  +--------------+---------+------+-----------+------------+--------+  RIGHT        Reflux NoRefluxReflux TimeDiameter cmsComments                          Yes                                   +--------------+---------+------+-----------+------------+--------+  CFV          no                                              +--------------+---------+------+-----------+------------+--------+  FV mid        no                                              +--------------+---------+------+-----------+------------+--------+  Popliteal    no                                              +--------------+---------+------+-----------+------------+--------+  GSV at SFJ    no                            .500              +--------------+---------+------+-----------+------------+--------+  GSV prox thighno                            .578              +--------------+---------+------+-----------+------------+--------+  GSV mid thigh no                            .610              +--------------+---------+------+-----------+------------+--------+  GSV dist thighno                            .586              +--------------+---------+------+-----------+------------+--------+  GSV at knee   no                            .451              +--------------+---------+------+-----------+------------+--------+  GSV prox calf           yes    >500 ms      .432              +--------------+---------+------+-----------+------------+--------+  GSV mid calf            yes    >500  ms      .347              +--------------+---------+------+-----------+------------+--------+  SSV Pop Fossa no                            .220              +--------------+---------+------+-----------+------------+--------+  SSV prox calf           yes    >500 ms      .224              +--------------+---------+------+-----------+------------+--------+  SSV mid calf            yes    >500 ms      .196              +--------------+---------+------+-----------+------------+--------+     +--------------+---------+------+-----------+------------+--------+  LEFT         Reflux NoRefluxReflux TimeDiameter cmsComments                          Yes                                   +--------------+---------+------+-----------+------------+--------+  CFV          no                                              +--------------+---------+------+-----------+------------+--------+  FV mid        no                                              +--------------+---------+------+-----------+------------+--------+  Popliteal    no                                              +--------------+---------+------+-----------+------------+--------+  GSV  at [facility]    no                            .599              +--------------+---------+------+-----------+------------+--------+  GSV prox thighno                            .493              +--------------+---------+------+-----------+------------+--------+  GSV mid thigh no                            .531              +--------------+---------+------+-----------+------------+--------+  GSV dist thighno                            .590              +--------------+---------+------+-----------+------------+--------+  GSV at knee   no                            .635              +--------------+---------+------+-----------+------------+--------+  GSV prox calf no                             .447              +--------------+---------+------+-----------+------------+--------+  GSV mid calf            yes    >500 ms      .404              +--------------+---------+------+-----------+------------+--------+  SSV Pop Fossa no                            .377              +--------------+---------+------+-----------+------------+--------+  SSV prox calf no                            .308              +--------------+---------+------+-----------+------------+--------+         Summary:  Bilateral:  - No evidence of deep vein thrombosis seen in the lower extremities,  bilaterally, from the common femoral through the popliteal veins.  - No evidence of superficial venous thrombosis in the lower extremities,  bilaterally.    Right:  - Venous reflux is noted in the right greater saphenous vein in the calf.  - Venous reflux is noted in the right short saphenous vein.    Left:  - Venous reflux is noted in the left greater saphenous vein in the calf.      Assessment/Plan:    47 year old male with history of multiple episodes of sclerotherapy that he states have really not work he is interested in getting phlebectomy.  I discussed with him that with reticular veins I do not think we would get a good result from phlebectomy alone he does not have any refluxing saphenous veins for treatment of any reflux component.  I have recommended additional sclero therapy consideration if he really wants his veins treated and  he was given business card for Cherene Julian, RN today.  All questions were answered and he can see me on an as-needed basis.     Maeola Harman MD [facility]

## 2022-11-09 ENCOUNTER — Other Ambulatory Visit: Payer: Self-pay | Admitting: Neurology

## 2022-11-09 DIAGNOSIS — G35 Multiple sclerosis: Secondary | ICD-10-CM

## 2022-11-17 ENCOUNTER — Other Ambulatory Visit: Payer: BC Managed Care – PPO

## 2022-11-17 ENCOUNTER — Ambulatory Visit
Admission: RE | Admit: 2022-11-17 | Discharge: 2022-11-17 | Disposition: A | Discharge status: Home / Self Care | Payer: BC Managed Care – PPO | Source: Ambulatory Visit | Attending: Neurology | Admitting: Neurology

## 2022-11-17 DIAGNOSIS — G35 Multiple sclerosis: Secondary | ICD-10-CM

## 2022-11-17 MED ORDER — GADOPICLENOL 0.5 MMOL/ML IV SOLN
10.0000 mL | Freq: Once | INTRAVENOUS | Status: AC | PRN
  Administered 2022-11-17: 10 mL via INTRAVENOUS

## 2022-11-26 DIAGNOSIS — E559 Vitamin D deficiency, unspecified: Secondary | ICD-10-CM | POA: Diagnosis not present

## 2022-11-26 DIAGNOSIS — G35 Multiple sclerosis: Secondary | ICD-10-CM | POA: Diagnosis not present

## 2022-11-28 DIAGNOSIS — G35 Multiple sclerosis: Secondary | ICD-10-CM | POA: Diagnosis not present

## 2023-01-03 DIAGNOSIS — I10 Essential (primary) hypertension: Secondary | ICD-10-CM | POA: Diagnosis not present

## 2023-01-03 DIAGNOSIS — Z125 Encounter for screening for malignant neoplasm of prostate: Secondary | ICD-10-CM | POA: Diagnosis not present

## 2023-01-03 DIAGNOSIS — E291 Testicular hypofunction: Secondary | ICD-10-CM | POA: Diagnosis not present

## 2023-01-03 DIAGNOSIS — E559 Vitamin D deficiency, unspecified: Secondary | ICD-10-CM | POA: Diagnosis not present

## 2023-01-03 DIAGNOSIS — R7309 Other abnormal glucose: Secondary | ICD-10-CM | POA: Diagnosis not present

## 2023-01-03 DIAGNOSIS — E782 Mixed hyperlipidemia: Secondary | ICD-10-CM | POA: Diagnosis not present

## 2023-01-31 DIAGNOSIS — M7541 Impingement syndrome of right shoulder: Secondary | ICD-10-CM | POA: Diagnosis not present

## 2023-02-20 DIAGNOSIS — M7540 Impingement syndrome of unspecified shoulder: Secondary | ICD-10-CM | POA: Insufficient documentation

## 2023-03-01 DIAGNOSIS — Z4789 Encounter for other orthopedic aftercare: Secondary | ICD-10-CM | POA: Insufficient documentation

## 2023-12-22 NOTE — Progress Notes (Deleted)
 [facility]  PATIENT: Alex Ramirez DOB: [birth year]  REFERRING DOCTOR OR PCP:  *** SOURCE: ***  _________________________________   HISTORICAL  CHIEF COMPLAINT:  No chief complaint on file.   HISTORY OF PRESENT ILLNESS:  ***   He is a 48 year old man who was diagnosed with MS after presenting in February 2021 with difficulty walking, right sided numbness and vertigo.  Of note, symptoms began after he received the first dose of the COVID-19 vaccine.  CSF was normal.  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  Follow-up MRIs since diagnosis have been stable.  I personally reviewed the MRIs from 2022 and 2024 and concur with the stability.  Most recently he has been seen Dr. Lynnann Cousin at [facility] in [facility].   Imaging: MRI of the cervical spine 11/17/2022 showed a T2 hyperintense focus towards the left adjacent to C2-C3, there was also a left disc osteophyte complex at C5-C6 that causes mild spinal stenosis and could affect the left C6 nerve root.  MRI of the brain 11/17/2022 showed ill-defined T2/FLAIR hyperintense foci in the periventricular white matter, unchanged compared to 07/15/2021.  Additional note of a left frontal arachnoid cyst..  There is a normal enhancement pattern.  MRI of the thoracic spine 10/09/2022 showed a normal spinal cord and no significant degenerative change.  Laboratory: 08/24/2019: CSF showed no oligoclonal bands.  Protein was normal. 09/09/2019: Anti-MOG IgG, anti-NMO IgG, ANCA, ANA, Lyme titer, lupus anticoagulant, anticardiolipin antibodies, ACE, rheumatoid factor and syphilis screen were all negative or noncontributory.  Vitamin D was normal. 11/26/2022: CBC with differential showed a normal lymphocyte count of 1500 cells per microliter  REVIEW OF SYSTEMS: Constitutional: No fevers, chills, sweats, or  change in appetite Eyes: No visual changes, double vision, eye pain Ear, nose and throat: No hearing loss, ear pain, nasal congestion, sore throat Cardiovascular: No chest pain, palpitations Respiratory:  No shortness of breath at rest or with exertion.   No wheezes GastrointestinaI: No nausea, vomiting, diarrhea, abdominal pain, fecal incontinence Genitourinary:  No dysuria, urinary retention or frequency.  No nocturia. Musculoskeletal:  No neck pain, back pain Integumentary: No rash, pruritus, skin lesions Neurological: as above Psychiatric: No depression at this time.  No anxiety Endocrine: No palpitations, diaphoresis, change in appetite, change in weigh or increased thirst Hematologic/Lymphatic:  No anemia, purpura, petechiae. Allergic/Immunologic: No itchy/runny eyes, nasal congestion, recent allergic reactions, rashes  ALLERGIES: Allergies  Allergen Reactions   Oxycodone Nausea And Vomiting    HOME MEDICATIONS:  Current Outpatient Medications:    amphetamine-dextroamphetamine (ADDERALL) 10 MG tablet, Take 10 mg by mouth 2 (two) times daily with a meal., Disp: , Rfl:    baclofen (LIORESAL) 10 MG tablet, Take 10 mg by mouth 2 (two) times daily as needed for muscle spasms., Disp: , Rfl:    busPIRone (BUSPAR) 5 MG tablet, Take 5 mg by mouth 2 (two) times daily., Disp: , Rfl:    Dimethyl Fumarate 120 MG CPDR, Take 120 mg by mouth daily., Disp: , Rfl:    ezetimibe (ZETIA) 10 MG tablet, Take 10 mg by mouth daily., Disp: , Rfl:    gabapentin (NEURONTIN) 600 MG tablet, Take 600 mg by mouth 2 (two) times daily., Disp: , Rfl:    hydrochlorothiazide (HYDRODIURIL) 12.5 MG tablet, Take 12.5 mg by mouth daily., Disp: , Rfl:    lamoTRIgine (LAMICTAL) 100 MG tablet,  Take 100 mg by mouth 2 (two) times daily., Disp: , Rfl:    Multiple Vitamins-Minerals (MULTIVITAMIN WITH MINERALS) tablet, Take 1 tablet by mouth daily., Disp: , Rfl:    tamsulosin (FLOMAX) 0.4 MG CAPS capsule, Take 0.4 mg by mouth  daily., Disp: , Rfl:    terazosin (HYTRIN) 5 MG capsule, Take 5 mg by mouth at bedtime., Disp: , Rfl:    tretinoin (RETIN-A) 0.05 % cream, Apply 1 Application topically at bedtime as needed (acne)., Disp: , Rfl:   PAST MEDICAL HISTORY: Past Medical History:  Diagnosis Date   Hyperlipidemia    Hypertension    MS (multiple sclerosis) (HCC)     PAST SURGICAL HISTORY: Past Surgical History:  Procedure Laterality Date   KNEE SURGERY Right    x 4, ACL    FAMILY HISTORY: Family History  Problem Relation Age of Onset   Congestive Heart Failure Mother    Diabetes Mother    Hypertension Mother    Heart attack Father 70   Congestive Heart Failure Father    Diabetes Father    High Cholesterol Father    Diabetes Sister    Diabetes Sister    Healthy Sister    Healthy Sister    Healthy Sister    Healthy Sister    Healthy Sister    Healthy Sister    Diabetes Brother    High Cholesterol Brother    Healthy Brother    Healthy Brother    Healthy Brother    Healthy Brother    Breast cancer Maternal Grandmother    Diabetes Paternal Grandmother    Diabetes Paternal Grandfather    Hypertension Paternal Grandfather     SOCIAL HISTORY: Social History   Socioeconomic History   Marital status: Married    Spouse name: Not on file   Number of children: Not on file   Years of education: Not on file   Highest education level: Not on file  Occupational History   Not on file  Tobacco Use   Smoking status: Former    Types: Cigarettes    Passive exposure: Past   Smokeless tobacco: Never  Vaping Use   Vaping status: Never Used  Substance and Sexual Activity   Alcohol use: Yes    Comment: couple times weekly   Drug use: Never   Sexual activity: Not on file  Other Topics Concern   Not on file  Social History Narrative   Not on file   Social Drivers of Health   Financial Resource Strain: Not on file  Food Insecurity: Not on file  Transportation Needs: Not on file  Physical  Activity: Not on file  Stress: Not on file  Social Connections: Not on file  Intimate Partner Violence: Not At Risk (08/02/2019)   Received from [facility]   Interpersonal Safety    Social Determinants: Intimate Partner Violence Past Fear: Not on file    Social Determinants: Intimate Partner Violence Current Fear: Not on file       PHYSICAL EXAM  There were no vitals filed for this visit.  There is no height or weight on file to calculate BMI.   General: The patient is well-developed and well-nourished and in no acute distress  HEENT:  Head is [place]/AT.  Sclera are anicteric.  Funduscopic exam shows normal optic discs and retinal vessels.  Neck: No carotid bruits are noted.  The neck is nontender.  Cardiovascular: The heart has a regular rate and rhythm with a normal  S1 and S2. There were no murmurs, gallops or rubs.    Skin: Extremities are without rash or  edema.  Musculoskeletal:  Back is nontender  Neurologic Exam  Mental status: The patient is alert and oriented x 3 at the time of the examination. The patient has apparent normal recent and remote memory, with an apparently normal attention span and concentration ability.   Speech is normal.  Cranial nerves: Extraocular movements are full. Pupils are equal, round, and reactive to light and accomodation.  Visual fields are full.  Facial symmetry is present. There is good facial sensation to soft touch bilaterally.Facial strength is normal.  Trapezius and sternocleidomastoid strength is normal. No dysarthria is noted.  The tongue is midline, and the patient has symmetric elevation of the soft palate. No obvious hearing deficits are noted.  Motor:  Muscle bulk is normal.   Tone is normal. Strength is  5 / 5 in all 4 extremities.   Sensory: Sensory testing is intact to pinprick, soft touch and vibration sensation in all 4 extremities.  Coordination: Cerebellar testing reveals good finger-nose-finger and heel-to-shin  bilaterally.  Gait and station: Station is normal.   Gait is normal. Tandem gait is normal. Romberg is negative.   Reflexes: Deep tendon reflexes are symmetric and normal bilaterally.   Plantar responses are flexor.    DIAGNOSTIC DATA (LABS, IMAGING, TESTING) - I reviewed patient records, labs, notes, testing and imaging myself where available.  Lab Results  Component Value Date   WBC 9.0 11/17/2021   HGB 13.6 11/17/2021   HCT 40.0 11/17/2021   MCV 80.3 11/17/2021   PLT 461 (H) 11/17/2021      Component Value Date/Time   NA 135 04/05/2022 0850   K 4.4 04/05/2022 0850   CL 98 04/05/2022 0850   CO2 26 04/05/2022 0850   GLUCOSE 92 04/05/2022 0850   GLUCOSE 74 11/17/2021 1901   BUN 17 04/05/2022 0850   CREATININE 1.49 (H) 04/05/2022 0850   CALCIUM 9.2 04/05/2022 0850   PROT 6.9 11/17/2021 1855   ALBUMIN 3.5 11/17/2021 1855   AST 178 (H) 11/17/2021 1855   ALT 87 (H) 11/17/2021 1855   ALKPHOS 31 (L) 11/17/2021 1855   BILITOT 0.6 11/17/2021 1855   GFRNONAA 59 (L) 11/17/2021 1855   No results found for: CHOL, HDL, LDLCALC, LDLDIRECT, TRIG, CHOLHDL No results found for: YHAJ8R No results found for: VITAMINB12 No results found for: TSH     ASSESSMENT AND PLAN  ***   Jasira Robinson A. Vear, MD, [facility] 12/22/2023, 5:07 PM Certified in Neurology, Clinical Neurophysiology, Sleep Medicine and Neuroimaging  [facility] [address], [address] [place], [place] [address] [phone]

## 2023-12-26 ENCOUNTER — Encounter: Payer: Self-pay | Admitting: Neurology

## 2023-12-26 ENCOUNTER — Ambulatory Visit: Admitting: Neurology

## 2024-01-08 ENCOUNTER — Encounter: Payer: Self-pay | Admitting: Neurology

## 2024-01-08 ENCOUNTER — Ambulatory Visit: Admitting: Neurology

## 2024-01-27 ENCOUNTER — Other Ambulatory Visit: Payer: Self-pay | Admitting: Internal Medicine

## 2024-01-27 DIAGNOSIS — N1831 Chronic kidney disease, stage 3a: Secondary | ICD-10-CM

## 2024-01-29 ENCOUNTER — Inpatient Hospital Stay: Admission: RE | Admit: 2024-01-29 | Source: Ambulatory Visit

## 2024-01-30 ENCOUNTER — Ambulatory Visit
Admission: RE | Admit: 2024-01-30 | Discharge: 2024-01-30 | Disposition: A | Discharge status: Home / Self Care | Source: Ambulatory Visit | Attending: Internal Medicine | Admitting: Internal Medicine

## 2024-01-30 DIAGNOSIS — N1831 Chronic kidney disease, stage 3a: Secondary | ICD-10-CM

## 2024-02-18 DIAGNOSIS — S43431A Superior glenoid labrum lesion of right shoulder, initial encounter: Secondary | ICD-10-CM | POA: Insufficient documentation

## 2024-04-06 HISTORY — PX: SHOULDER SURGERY: SHX246

## 2024-04-15 DIAGNOSIS — Z9889 Other specified postprocedural states: Secondary | ICD-10-CM | POA: Insufficient documentation

## 2024-04-17 DIAGNOSIS — S46119A Strain of muscle, fascia and tendon of long head of biceps, unspecified arm, initial encounter: Secondary | ICD-10-CM | POA: Insufficient documentation

## 2024-04-17 DIAGNOSIS — M6281 Muscle weakness (generalized): Secondary | ICD-10-CM | POA: Insufficient documentation

## 2024-04-17 DIAGNOSIS — M25611 Stiffness of right shoulder, not elsewhere classified: Secondary | ICD-10-CM | POA: Insufficient documentation

## 2024-04-23 DIAGNOSIS — Z0289 Encounter for other administrative examinations: Secondary | ICD-10-CM

## 2024-05-17 NOTE — Progress Notes (Unsigned)
 "  [facility]  PATIENT: Alex Ramirez DOB: 09-26-1975  REFERRING DOCTOR OR PCP: Suzen Lamer, MD SOURCE: Patient, notes from [facility] neurology, imaging and lab reports, MRI images personally reviewed  _________________________________   HISTORICAL  CHIEF COMPLAINT:  No chief complaint on file.   HISTORY OF PRESENT ILLNESS:  ***  He is a 49 year old man who was diagnosed with MS in 2021 after presenting with gait disturbance right arm numbness, foot numbness and vertigo.  At the time showed lesions of the brain and cervical spinal cord though the CSF was normal.  He was going to be started on Gilenya but was found to have a second-degree heart block and was evaluated by cardiology therefore, he started dimethyl fumarate  in January 2022.      Imaging: MRI of the head 11/17/2022 shows a small number of T2/FLAIR hyperintense foci in the periventricular and deep white matter.  None of the foci enhance after contrast  MRI of the cervical spine 11/17/2022 showed a T2 hyperintense focus anteriorly towards the left adjacent to C2-C3 consistent with a chronic demyelinating plaque.  At C5-C6 there is mild spinal stenosis and left foraminal narrowing due disc bulging and a disc osteophyte complex.  MRI of the thoracic spine 10/09/2022 was normal.  No significant degenerative changes noted.  REVIEW OF SYSTEMS: Constitutional: No fevers, chills, sweats, or change in appetite Eyes: No visual changes, double vision, eye pain Ear, nose and throat: No hearing loss, ear pain, nasal congestion, sore throat Cardiovascular: No chest pain, palpitations Respiratory:  No shortness of breath at rest or with exertion.   No wheezes GastrointestinaI: No nausea, vomiting, diarrhea, abdominal pain, fecal incontinence Genitourinary:  No dysuria, urinary retention or frequency.  No nocturia. Musculoskeletal:  No neck pain, back pain Integumentary: No rash, pruritus, skin  lesions Neurological: as above Psychiatric: No depression at this time.  No anxiety Endocrine: No palpitations, diaphoresis, change in appetite, change in weigh or increased thirst Hematologic/Lymphatic:  No anemia, purpura, petechiae. Allergic/Immunologic: No itchy/runny eyes, nasal congestion, recent allergic reactions, rashes  ALLERGIES: Allergies[1]  HOME MEDICATIONS: Current Medications[2]  PAST MEDICAL HISTORY: Past Medical History:  Diagnosis Date   Hyperlipidemia    Hypertension    MS (multiple sclerosis)     PAST SURGICAL HISTORY: Past Surgical History:  Procedure Laterality Date   KNEE SURGERY Right    x 4, ACL    FAMILY HISTORY: Family History  Problem Relation Age of Onset   Congestive Heart Failure Mother    Diabetes Mother    Hypertension Mother    Heart attack Father 75   Congestive Heart Failure Father    Diabetes Father    High Cholesterol Father    Diabetes Sister    Diabetes Sister    Healthy Sister    Healthy Sister    Healthy Sister    Healthy Sister    Healthy Sister    Healthy Sister    Diabetes Brother    High Cholesterol Brother    Healthy Brother    Healthy Brother    Healthy Brother    Healthy Brother    Breast cancer Maternal Grandmother    Diabetes Paternal Grandmother    Diabetes Paternal Grandfather    Hypertension Paternal Grandfather     SOCIAL HISTORY: Social History   Socioeconomic History   Marital status: Married    Spouse name: Not on file   Number of children: Not on file   Years of education: Not on file  Highest education level: Not on file  Occupational History   Not on file  Tobacco Use   Smoking status: Former    Types: Cigarettes    Passive exposure: Past   Smokeless tobacco: Never  Vaping Use   Vaping status: Never Used  Substance and Sexual Activity   Alcohol use: Yes    Comment: couple times weekly   Drug use: Never   Sexual activity: Not on file  Other Topics Concern   Not on file   Social History Narrative   Not on file   Social Drivers of Health   Tobacco Use: Low Risk (11/28/2022)   Received from [facility]   Patient History    Smoking Tobacco Use: Never    Smokeless Tobacco Use: Never    Passive Exposure: Past  Recent Concern: Tobacco Use - Medium Risk (10/10/2022)   Patient History    Smoking Tobacco Use: Former    Smokeless Tobacco Use: Never    Passive Exposure: Past  Programmer, 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  There is no height or weight on file to calculate BMI.   General: The patient is well-developed and well-nourished and in no acute distress  HEENT:  Head is [place]/AT.  Sclera are anicteric.  Funduscopic exam shows normal optic discs and retinal vessels.  Neck: No carotid bruits are noted.  The neck is nontender.  Cardiovascular: The heart has a regular rate and rhythm with a normal S1 and S2. There were no murmurs, gallops or rubs.    Skin: Extremities are without rash or  edema.  Musculoskeletal:  Back is nontender  Neurologic Exam  Mental status: The patient is alert and oriented x 3 at the time of the examination. The patient has apparent normal recent and remote memory, with an apparently normal attention span and concentration ability.   Speech is normal.  Cranial nerves: Extraocular movements are full. Pupils are equal, round, and reactive to light and accomodation.  Visual fields are full.  Facial symmetry is present. There is good facial sensation to soft touch bilaterally.Facial strength is normal.   Trapezius and sternocleidomastoid strength is normal. No dysarthria is noted.  The tongue is midline, and the patient has symmetric elevation of the soft palate. No obvious hearing deficits are noted.  Motor:  Muscle bulk is normal.   Tone is normal. Strength is  5 / 5 in all 4 extremities.   Sensory: Sensory testing is intact to pinprick, soft touch and vibration sensation in all 4 extremities.  Coordination: Cerebellar testing reveals good finger-nose-finger and heel-to-shin bilaterally.  Gait and station: Station is normal.   Gait is normal. Tandem gait is normal. Romberg is negative.   Reflexes: Deep tendon reflexes are symmetric and normal bilaterally.   Plantar responses are flexor.    DIAGNOSTIC DATA (LABS, IMAGING, TESTING) - I reviewed patient records, labs, notes, testing and imaging myself where available.  Lab Results  Component Value Date   WBC 9.0 11/17/2021   HGB 13.6  11/17/2021   HCT 40.0 11/17/2021   MCV 80.3 11/17/2021   PLT 461 (H) 11/17/2021      Component Value Date/Time   NA 135 04/05/2022 0850   K 4.4 04/05/2022 0850   CL 98 04/05/2022 0850   CO2 26 04/05/2022 0850   GLUCOSE 92 04/05/2022 0850   GLUCOSE 74 11/17/2021 1901   BUN 17 04/05/2022 0850   CREATININE 1.49 (H) 04/05/2022 0850   CALCIUM 9.2 04/05/2022 0850   PROT 6.9 11/17/2021 1855   ALBUMIN 3.5 11/17/2021 1855   AST 178 (H) 11/17/2021 1855   ALT 87 (H) 11/17/2021 1855   ALKPHOS 31 (L) 11/17/2021 1855   BILITOT 0.6 11/17/2021 1855   GFRNONAA 59 (L) 11/17/2021 1855   No results found for: CHOL, HDL, LDLCALC, LDLDIRECT, TRIG, CHOLHDL No results found for: YHAJ8R No results found for: VITAMINB12 No results found for: TSH     ASSESSMENT AND PLAN  ***   Tammee Thielke A. Vear, MD, [facility] 05/17/2024, 3:48 PM Certified in Neurology, Clinical Neurophysiology, Sleep Medicine and Neuroimaging  [facility] [address] [place], [place]  [address] [phone]    [1]  Allergies Allergen Reactions   Oxycodone Nausea And Vomiting  [2]  Current Outpatient Medications:    amphetamine-dextroamphetamine (ADDERALL) 10 MG tablet, Take 10 mg by mouth 2 (two) times daily with a meal., Disp: , Rfl:    baclofen  (LIORESAL ) 10 MG tablet, Take 10 mg by mouth 2 (two) times daily as needed for muscle spasms., Disp: , Rfl:    busPIRone (BUSPAR) 5 MG tablet, Take 5 mg by mouth 2 (two) times daily., Disp: , Rfl:    Dimethyl Fumarate  120 MG CPDR, Take 120 mg by mouth daily., Disp: , Rfl:    ezetimibe (ZETIA) 10 MG tablet, Take 10 mg by mouth daily., Disp: , Rfl:    gabapentin  (NEURONTIN ) 600 MG tablet, Take 600 mg by mouth 2 (two) times daily., Disp: , Rfl:    hydrochlorothiazide (HYDRODIURIL) 12.5 MG tablet, Take 12.5 mg by mouth daily., Disp: , Rfl:    lamoTRIgine  (LAMICTAL ) 100 MG tablet, Take 100 mg by mouth 2 (two) times daily., Disp: , Rfl:    Multiple Vitamins-Minerals (MULTIVITAMIN WITH MINERALS) tablet, Take 1 tablet by mouth daily., Disp: , Rfl:    tamsulosin (FLOMAX) 0.4 MG CAPS capsule, Take 0.4 mg by mouth daily., Disp: , Rfl:    terazosin (HYTRIN) 5 MG capsule, Take 5 mg by mouth at bedtime., Disp: , Rfl:    tretinoin (RETIN-A) 0.05 % cream, Apply 1 Application topically at bedtime as needed (acne)., Disp: , Rfl:   "

## 2024-05-18 ENCOUNTER — Encounter: Payer: Self-pay | Admitting: Neurology

## 2024-05-18 ENCOUNTER — Ambulatory Visit: Admitting: Neurology

## 2024-05-18 VITALS — BP 136/81 | HR 80 | Ht 74.0 in | Wt 299.0 lb

## 2024-05-18 DIAGNOSIS — E559 Vitamin D deficiency, unspecified: Secondary | ICD-10-CM

## 2024-05-18 DIAGNOSIS — G35A Relapsing-remitting multiple sclerosis: Secondary | ICD-10-CM | POA: Diagnosis not present

## 2024-05-18 DIAGNOSIS — G4719 Other hypersomnia: Secondary | ICD-10-CM | POA: Insufficient documentation

## 2024-05-18 DIAGNOSIS — R208 Other disturbances of skin sensation: Secondary | ICD-10-CM | POA: Diagnosis not present

## 2024-05-18 DIAGNOSIS — G4733 Obstructive sleep apnea (adult) (pediatric): Secondary | ICD-10-CM | POA: Insufficient documentation

## 2024-05-18 DIAGNOSIS — Z79899 Other long term (current) drug therapy: Secondary | ICD-10-CM | POA: Diagnosis not present

## 2024-05-18 DIAGNOSIS — R5383 Other fatigue: Secondary | ICD-10-CM | POA: Diagnosis not present

## 2024-05-18 DIAGNOSIS — R41840 Attention and concentration deficit: Secondary | ICD-10-CM | POA: Diagnosis not present

## 2024-05-18 DIAGNOSIS — R2689 Other abnormalities of gait and mobility: Secondary | ICD-10-CM | POA: Diagnosis not present

## 2024-05-18 MED ORDER — BACLOFEN 10 MG PO TABS: 10.0000 mg | ORAL_TABLET | Freq: Three times a day (TID) | ORAL | 3 refills | Status: AC

## 2024-05-18 MED ORDER — LAMOTRIGINE 100 MG PO TABS: 100.0000 mg | ORAL_TABLET | Freq: Two times a day (BID) | ORAL | 3 refills | Status: AC

## 2024-05-18 MED ORDER — DIMETHYL FUMARATE 240 MG PO CPDR: DELAYED_RELEASE_CAPSULE | ORAL | 3 refills | Status: AC

## 2024-05-18 MED ORDER — GABAPENTIN 600 MG PO TABS: 600.0000 mg | ORAL_TABLET | Freq: Three times a day (TID) | ORAL | 3 refills | Status: AC

## 2024-05-19 ENCOUNTER — Ambulatory Visit: Payer: Self-pay | Admitting: Neurology

## 2024-05-19 LAB — CBC WITH DIFFERENTIAL/PLATELET
Basophils Absolute: 0 x10E3/uL (ref 0.0–0.2)
Basos: 0 %
EOS (ABSOLUTE): 0.1 x10E3/uL (ref 0.0–0.4)
Eos: 2 %
Hematocrit: 41.9 % (ref 37.5–51.0)
Hemoglobin: 13.5 g/dL (ref 13.0–17.7)
Immature Grans (Abs): 0 x10E3/uL (ref 0.0–0.1)
Immature Granulocytes: 0 %
Lymphocytes Absolute: 1.2 x10E3/uL (ref 0.7–3.1)
Lymphs: 21 %
MCH: 28.9 pg (ref 26.6–33.0)
MCHC: 32.2 g/dL (ref 31.5–35.7)
MCV: 90 fL (ref 79–97)
Monocytes Absolute: 0.6 x10E3/uL (ref 0.1–0.9)
Monocytes: 10 %
Neutrophils Absolute: 3.9 x10E3/uL (ref 1.4–7.0)
Neutrophils: 67 %
Platelets: 248 x10E3/uL (ref 150–450)
RBC: 4.67 x10E6/uL (ref 4.14–5.80)
RDW: 15.1 % (ref 11.6–15.4)
WBC: 5.8 x10E3/uL (ref 3.4–10.8)

## 2024-05-19 LAB — VITAMIN D 25 HYDROXY (VIT D DEFICIENCY, FRACTURES): Vit D, 25-Hydroxy: 26.8 ng/mL — ABNORMAL LOW (ref 30.0–100.0)

## 2024-05-19 LAB — HEPATIC FUNCTION PANEL
ALT: 49 IU/L — ABNORMAL HIGH (ref 0–44)
AST: 48 IU/L — ABNORMAL HIGH (ref 0–40)
Albumin: 4.1 g/dL (ref 4.1–5.1)
Alkaline Phosphatase: 66 IU/L (ref 47–123)
Bilirubin Total: 0.3 mg/dL (ref 0.0–1.2)
Bilirubin, Direct: 0.11 mg/dL (ref 0.00–0.40)
Total Protein: 6.8 g/dL (ref 6.0–8.5)

## 2024-05-27 ENCOUNTER — Ambulatory Visit

## 2024-05-27 DIAGNOSIS — R208 Other disturbances of skin sensation: Secondary | ICD-10-CM

## 2024-05-27 DIAGNOSIS — G35A Relapsing-remitting multiple sclerosis: Secondary | ICD-10-CM

## 2024-05-27 MED ORDER — GADOBENATE DIMEGLUMINE 529 MG/ML IV SOLN
20.0000 mL | Freq: Once | INTRAVENOUS | Status: AC | PRN
  Administered 2024-05-27: 20 mL via INTRAVENOUS

## 2024-06-03 ENCOUNTER — Other Ambulatory Visit

## 2024-06-09 ENCOUNTER — Ambulatory Visit

## 2024-06-09 DIAGNOSIS — R208 Other disturbances of skin sensation: Secondary | ICD-10-CM

## 2024-06-09 DIAGNOSIS — G35A Relapsing-remitting multiple sclerosis: Secondary | ICD-10-CM

## 2024-06-09 MED ORDER — GADOBENATE DIMEGLUMINE 529 MG/ML IV SOLN
20.0000 mL | Freq: Once | INTRAVENOUS | Status: AC | PRN
  Administered 2024-06-09: 20 mL via INTRAVENOUS
